# Patient Record
Sex: Male | Born: 1945
Health system: Southern US, Community
[De-identification: ages and names within clinical notes are randomized; demographics above are authoritative.]

## PROBLEM LIST (undated history)

## (undated) DIAGNOSIS — Z87442 Personal history of urinary calculi: Secondary | ICD-10-CM

## (undated) DIAGNOSIS — R7989 Other specified abnormal findings of blood chemistry: Secondary | ICD-10-CM

## (undated) DIAGNOSIS — E785 Hyperlipidemia, unspecified: Secondary | ICD-10-CM

## (undated) DIAGNOSIS — N200 Calculus of kidney: Secondary | ICD-10-CM

## (undated) HISTORY — PX: COLONOSCOPY: SHX174

## (undated) HISTORY — PX: HERNIA REPAIR: SHX51

## (undated) HISTORY — DX: Hyperlipidemia, unspecified: E78.5

---

## 1996-08-02 HISTORY — PX: HERNIA REPAIR: SHX51

## 2003-02-12 ENCOUNTER — Encounter: Payer: Self-pay | Admitting: Emergency Medicine

## 2003-02-12 ENCOUNTER — Emergency Department (HOSPITAL_COMMUNITY): Admission: EM | Admit: 2003-02-12 | Discharge: 2003-02-12 | Payer: Self-pay | Admitting: Emergency Medicine

## 2003-10-15 ENCOUNTER — Ambulatory Visit (HOSPITAL_COMMUNITY): Admission: RE | Admit: 2003-10-15 | Discharge: 2003-10-15 | Payer: Self-pay | Admitting: Internal Medicine

## 2005-02-15 ENCOUNTER — Ambulatory Visit: Payer: Self-pay | Admitting: Orthopedic Surgery

## 2005-03-15 ENCOUNTER — Ambulatory Visit: Payer: Self-pay | Admitting: Orthopedic Surgery

## 2008-01-01 ENCOUNTER — Observation Stay (HOSPITAL_COMMUNITY): Admission: RE | Admit: 2008-01-01 | Discharge: 2008-01-02 | Payer: Self-pay | Admitting: General Surgery

## 2008-01-04 ENCOUNTER — Emergency Department (HOSPITAL_COMMUNITY): Admission: EM | Admit: 2008-01-04 | Discharge: 2008-01-05 | Payer: Self-pay | Admitting: Emergency Medicine

## 2010-12-15 NOTE — H&P (Signed)
James Cline, James Cline NO.:  [id number]   MEDICAL RECORD NO.:  [id number]          PATIENT TYPE:  AMB   LOCATION:  DAY                           FACILITY:  [facility]   PHYSICIAN:  Tilford Pillar, MD      DATE OF BIRTH:  09/01/1945   DATE OF ADMISSION:  DATE OF DISCHARGE:  LH                              HISTORY & PHYSICAL   CHIEF COMPLAINT:  Pain in the right groin.   HISTORY OF PRESENT ILLNESS:  The patient is a 65 year old male with a  history of increasing discomfort in his right groin.  He states that  this has been ongoing since approximately October 16, 2007.  On 21st, he  noted pain in the right groin and noted increasing bulge in this area.  This bulging is intermittent.  It does improve in the morning, but  worsens with increased activity.  He describes the pain as a constant  aching pain, especially during the day, it persist.  He has had no  associated nausea, vomiting, fever, or chills.  No skin changes.  No  change in bowel movements.  No hematochezia.  No melena.  No prior  history of hernias.  He has had no history of bulging on the left side.  He has had no discomfort on the left side.  No history of claudications.   PAST MEDICAL HISTORY:  Hypercholesterolemia.   PAST SURGICAL HISTORY:  None.   MEDICATIONS:  Simvastatin.   ALLERGIES:  No known drug allergies.   SOCIAL HISTORY:  No tobacco, no alcohol use.  Occupation, he works as a  Production designer, theatre/television/film.  He does do some heavy lifting with work.   REVIEW OF SYSTEMS:  CONSTITUTIONAL:  Unremarkable.  EYES:  Unremarkable.  EARS, NOSE, AND THROAT:  Occasional rhinorrhea.  RESPIRATORY:  Unremarkable.  CARDIOVASCULAR:  Unremarkable.  GASTROINTESTINAL:  Unremarkable.  GENITOURINARY:  Unremarkable.  MUSCULOSKELETAL:  Unremarkable.  SKIN:  Unremarkable.  ENDO:  Unremarkable.  NEURO:  Unremarkable.   PHYSICAL EXAMINATION:  GENERAL:  The patient is a healthy well-appearing  male in no acute distress.  He is alert and  oriented x3.  HEENT:  Scalp with no deformities or masses.  EYES:  Pupils equal, round, and reactive.  Extraocular movements are  intact.  No conjunctival pallor is noted.  Oral mucosa is pink.  Normal  occlusion.  NECK:  Trachea is midline.  No cervical lymphadenopathy is appreciated.  PULMONARY:  Unlabored respirations.  No wheezes.  No crackles.  He is  clear to auscultation bilaterally.  CARDIOVASCULAR:  He does have an irregularly irregular rhythm, but this  is not in increased rate, occasional early beep noted.  No murmurs are  appreciated.  No gallops.  EXTREMITIES:  He has 2+ femoral pulses bilaterally.  ABDOMEN:  Positive bowel sounds.  Abdomen is soft and nontender.  He  does have bilateral inguinal hernias, but the right hernia being larger  than the left.  Both of these are reducible.  No masses are appreciated.  GENITOURINARY:  He has normal external appearing male genitalia.  He  has  bilateral descended testicles.  SKIN:  Warm and dry.   ASSESSMENT AND PLAN:  Bilateral inguinal hernias.  At this point, I had  a long conversation with the patient in regards to his hernia of the  right side of his symptomatic site.  I do recommend repairing this at a  sooner rather than later date.  It is reducible, and I did discuss with  the patient the possibility of repairing this in the future.  However,  he and his wife were in agreement, this should be repaired sooner rather  than later.  I also discussed with the patient that it is not the reason  for the repair to avoid strangulation or incarceration of the hernia  rather than try to alleviate his pain.  Risks, benefits, and  alternatives of herniorrhaphy with mesh were discussed at length with  the patient including but not limited to the risk of bleeding,  infection, ischemic orchitis, paresthesia, as well as the possibility of  testicular loss, as well as the possibility of intraoperative pulmonary  or cardiac events.  Typical  recovery was discussed at length with the  patient, but the patient and the patient's wife did further inquire  about repairing both hernias at the same time.  I did discuss that this  is a possibility, although with the bilateral pain that recovery may be  somewhat slow, but I did discuss the risks, benefits, and alternatives  of a solitary versus a bilateral repair.  The patient does wish to  proceed with the right hernia repair at this time.  He is going to think  about the possibility of proceeding with a bilateral inguinal  herniorrhaphy over the next couple of days.  He is to call if he changes  his mind and wish to proceed with bilateral hernia repair.      Tilford Pillar, MD  Electronically Signed     BZ/MEDQ  D:  12/28/2007  T:  12/29/2007  Job:  [id number]   cc:   Short Stay Surgery   Kingsley Callander. Ouida Sills, MD  Fax: [phone]

## 2010-12-15 NOTE — Op Note (Signed)
NAMECONNY, MOENING NO.:  [id number]   MEDICAL RECORD NO.:  [id number]          PATIENT TYPE:  AMB   LOCATION:  DAY                           FACILITY:  [facility]   PHYSICIAN:  Tilford Pillar, MD      DATE OF BIRTH:  1946/06/09   DATE OF PROCEDURE:  01/01/2008  DATE OF DISCHARGE:                               OPERATIVE REPORT   PREOPERATIVE DIAGNOSIS:  Bilateral inguinal hernias (reducible).   POSTOPERATIVE DIAGNOSIS:  Bilateral inguinal hernias (reducible).   PROCEDURE:  Right inguinal herniorrhaphy with mesh and left inguinal  herniorrhaphy with mesh via separate skin incisions   SURGEON:  Tilford Pillar, MD   ANESTHESIA:  MAC with laryngeal mask airway control, local anesthetic,  1% Sensorcaine plain.   SPECIMEN:  None.   ESTIMATED BLOOD LOSS:  Minimal.   INTRAOPERATIVE FINDINGS:  Right-sided large indirect hernia component,  left side small indirect and direct hernia components.   INDICATIONS:  The patient is a 65 year old male who presented to my  office as an outpatient on referral with complaints of right groin pain.  He was told to try Cardura and this has been going on for several months  but has increased in size and discomfort.  On evaluation, it was noted  that he indeed have a right inguinal hernia which was reducible.  On the  further evaluation, it was also noted that he had a small asymptomatic  left inguinal hernia.  Risks, benefits, and alternatives of hernia  repair were discussed at length with the patient including, but not  limited to risk of bleeding, mesh infection requiring the removal of the  mesh, and interval repair of the hernia as well as the possibility of  ischemic orchitis, testicular loss, and permanent paresthesia.  Possibility of intraoperative cardiac and pulmonary events were also  discussed with the patient.  It was discussed with the patient the risks  and alternatives of unilateral versus bilateral repair with  likely  increased pain on recovery with bilateral repair versus 2 separate  operations.  With time allotted to think about it, the patient did  wished to proceed with bilateral hernia repair and consent was obtained.   OPERATION IN DETAIL:  The patient was taken to the operating table and  placed in supine position on the operative table at which time the  anesthetic was administered.  Once the patient was asleep, laryngeal  mask airway was placed.  At this point, the patient's groins were  prepped and draped in the usual fashion.  A marking pen was utilized to  mark the planned incision on both sides.  A skin incision was made  initially on the right side with a scalpel.  Additional dissection down  to the subcuticular tissue including Scarpa fascia was carried out using  electrocautery.  Upon reaching the external oblique fascia, this was  scored with a scalpel and divided medially and laterally with the  Metzenbaum scissors.  At this point, a large hernia was identified.  The  hernia and the cord structures were freed from the surrounding  canal  with blunt finger dissection and then a window was created behind the  cord structures where a Penrose drain was placed in order to assist  elevating the cord structures up into the field.  The cord structures  were isolated.  The floor of the canal was then evaluated with no  evidence of any direct hernia component.  At this point, the hernia sac  was dissected free from the cord structures.  There was no incarcerated  material noted within the hernia sac which was twisted upon itself.  A  high suture ligation was performed using a 2-0 Novofil suture.  The  redundant portion of the peritoneal sac was divided using the  electrocautery and was disposed.  At this point, the remnant  demonstrated no evidence of any bleeding and it was allowed to reduce  back into the abdominal cavity.  At this time, a lipoma of the cord was  also dissected free  from the cord structures and once freed it was also  disposed with the divided sac material.  Using a Prolene mesh system, a  mesh plug was placed into the internal inguinal ring.  This was suture  secured with 2-0 Novofil sutures and then the mesh overlay was brought  into the field.  It was packed medially over the pubic tubercle,  inferiorly to the shelving portion of the inguinal ligament, and  superiorly to the conjoined tendon.  The keyhole defect was secured  around the cord structures and the tails were placed laterally  underneath the external oblique fascia.  I was quite pleased with the  appearance of the repair.  The wound was irrigated.  The external  oblique fascia was reapproximated using a 2-0 Vicryl and then a local  anesthetic was instilled.  A 3-0 Vicryl was utilized to reapproximate  the Scarpa fascia in a running continuous fashion and a 4-0 Monocryl was  utilized to reapproximate the skin edges with running subcuticular  suture.  As my attention was turned to the opposite side, a incision was  made in the left groin with a scalpel.  Additional dissection down to  the subcuticular tissue was carried out using electrocautery including  division of the Scarpa fascia.  The external oblique fascia was scored  with a scalpel and Metzenbaum scissors were utilized to further divide  the external oblique fascia medially and laterally with sharp  dissection.  At this point, the cord structures were identified.  A  window was created behind the cord structures.  A Penrose drain was  utilized to elevate the cord structures up into the field.  A indirect  hernia was identified as well as a small direct hernia.  The direct  hernia was dissected free from the cord structures and was high-ligated  with a 2-0 Novofil and divided using electrocautery.  The redundant sac  was disposed.  A mesh plug was placed into the internal inguinal ring.  A 2-0 Novofil was utilized to secure this x2  and then the mesh overlay  was inserted.  This was packed circumferentially using 2-0 Novofil,  again medially over the pubic tubercle, inferiorly to the shelving  portion of the inguinal ligament, and superiorly to the conjoined  tendon.  The tails were tucked underneath the external oblique fascia  and then the wound was irrigated.  2-0 Vicryl sutures were utilized to  reapproximate the external oblique fascia.  A 3-0 Vicryl was utilized to  reapproximate the Scarpa fascia and then a  4-0 Monocryl was utilized to  reapproximate the skin edges in a running subcuticular suture.  The skin  was washed and dried with moist and dry towels on both sides.  Benzoin  was applied on both incisions.  Half-inch Steri-Strips were placed and  the drapes were removed.  The patient was allowed to come out of the  general anesthetic and was transferred back to regular hospital bed and  was transferred to the postanesthetic care unit in stable condition.  At  the conclusion of the procedure, all instrument, sponge, and needle  counts were correct.  The patient tolerated the procedure well.      Tilford Pillar, MD  Electronically Signed     BZ/MEDQ  D:  01/01/2008  T:  01/02/2008  Job:  [id number]   cc:   Kingsley Callander. Ouida Sills, MD  Fax: [phone]

## 2010-12-18 NOTE — Op Note (Signed)
NAMESQUIRE, WITHEY                             ACCOUNT NO.:  [id number]   MEDICAL RECORD NO.:  [id number]                   PATIENT TYPE:  AMB   LOCATION:  DAY                                  FACILITY:  [facility]   PHYSICIAN:  Lionel December, M.D.                 DATE OF BIRTH:  [birth year]   DATE OF PROCEDURE:  10/15/2003  DATE OF DISCHARGE:                                 OPERATIVE REPORT   PROCEDURE:  Total colonoscopy.   INDICATIONS FOR PROCEDURE:  James Cline is a 65 year old Caucasian male who is here  for a screening colonoscopy.  He is free of any GI symptoms. Family history  is negative for colorectal carcinoma. The procedure is reviewed with the  patient and informed consent was obtained.   PREOP MEDICATIONS:  Demerol 25 mg IV, Versed 4 mg IV.   FINDINGS:  The procedure was performed in the Endoscopy Suite. The patient's  vital signs and O2 saturations were monitored during the procedure and  remained stable. The patient was placed in the left lateral decubitus  position and a rectal examination performed. No abnormality noted on  external or digital exam. The Olympus videoscope was placed in the rectum  and advanced under direct vision into the sigmoid colon. He had some  __________coating his mucosa.  Overall his preparation was felt to be quite  satisfactory.  The scope was passed through the cecum which was identified  by the appendiceal orifice and the ileocecal valve. Pictures taken for the  record. As the scope was withdrawn, the colonic mucosa was carefully  examined and was normal throughout. The rectal mucosa was also normal. The  scope was retroflexed to examine the anorectal junction and small  hemorrhoids were noted below the dentate line. The endoscope was  straightened and withdrawn. The patient tolerated the procedure well.   FINAL DIAGNOSES:  Small external hemorrhoids otherwise normal colonoscopy.   RECOMMENDATIONS:  He should continue yearly Hemoccults and consider  next  screening exam in 10 years from now.      ___________________________________________                                            Lionel December, M.D.   NR/MEDQ  D:  10/15/2003  T:  10/15/2003  Job:  [id number]   cc:   Kingsley Callander. Ouida Sills, M.D.  [address]  [place]  [place] [address]  Fax: [phone]

## 2011-01-15 ENCOUNTER — Other Ambulatory Visit (HOSPITAL_COMMUNITY): Payer: Self-pay | Admitting: Internal Medicine

## 2011-01-15 ENCOUNTER — Ambulatory Visit (HOSPITAL_COMMUNITY)
Admission: RE | Admit: 2011-01-15 | Discharge: 2011-01-15 | Disposition: A | Discharge status: Home / Self Care | Payer: BC Managed Care – PPO | Source: Ambulatory Visit | Attending: Internal Medicine | Admitting: Internal Medicine

## 2011-01-15 DIAGNOSIS — T148XXA Other injury of unspecified body region, initial encounter: Secondary | ICD-10-CM

## 2011-01-15 DIAGNOSIS — M79609 Pain in unspecified limb: Secondary | ICD-10-CM | POA: Insufficient documentation

## 2011-01-15 DIAGNOSIS — R52 Pain, unspecified: Secondary | ICD-10-CM

## 2011-01-15 DIAGNOSIS — M7730 Calcaneal spur, unspecified foot: Secondary | ICD-10-CM | POA: Insufficient documentation

## 2011-02-18 ENCOUNTER — Ambulatory Visit (INDEPENDENT_AMBULATORY_CARE_PROVIDER_SITE_OTHER): Payer: BC Managed Care – PPO | Admitting: Otolaryngology

## 2011-02-18 DIAGNOSIS — G4700 Insomnia, unspecified: Secondary | ICD-10-CM

## 2011-02-18 DIAGNOSIS — J342 Deviated nasal septum: Secondary | ICD-10-CM

## 2011-02-18 DIAGNOSIS — G4730 Sleep apnea, unspecified: Secondary | ICD-10-CM

## 2011-02-18 DIAGNOSIS — J343 Hypertrophy of nasal turbinates: Secondary | ICD-10-CM

## 2011-02-18 DIAGNOSIS — J310 Chronic rhinitis: Secondary | ICD-10-CM

## 2011-03-25 ENCOUNTER — Ambulatory Visit (INDEPENDENT_AMBULATORY_CARE_PROVIDER_SITE_OTHER): Payer: BC Managed Care – PPO | Admitting: Otolaryngology

## 2011-04-28 LAB — BASIC METABOLIC PANEL
CO2: 26
Calcium: 9
Creatinine, Ser: 1.01
GFR calc Af Amer: >60

## 2011-04-28 LAB — CBC
MCHC: 35.6
RBC: 5.01
RDW: 12.8

## 2011-04-29 LAB — DIFFERENTIAL
Basophils Absolute: 0
Basophils Relative: 0
Eosinophils Absolute: 0
Eosinophils Relative: 0
Lymphocytes Relative: 7 — ABNORMAL LOW

## 2011-04-29 LAB — BASIC METABOLIC PANEL
BUN: 8
GFR calc non Af Amer: >60
Glucose, Bld: 141 — ABNORMAL HIGH
Potassium: 3.8

## 2011-04-29 LAB — CBC
HCT: 44.8
MCHC: 35.5
MCV: 94.8
Platelets: 150
RDW: 12.7

## 2011-04-29 LAB — URINALYSIS, ROUTINE W REFLEX MICROSCOPIC
Ketones, ur: NEGATIVE
Nitrite: NEGATIVE
Protein, ur: NEGATIVE
Urobilinogen, UA: 0.2

## 2011-04-29 LAB — POCT CARDIAC MARKERS
CKMB, poc: <1 — ABNORMAL LOW
Myoglobin, poc: 81.3
Troponin i, poc: <0.05

## 2013-04-15 IMAGING — CR DG FOOT COMPLETE 3+V*L*
3 series · 3 of 3 positions shown · non-contrast
Comparison: None

CLINICAL DATA: Fifth metatarsal pain

LEFT FOOT - COMPLETE 3+ VIEW

[view not recorded (1 of 3)]
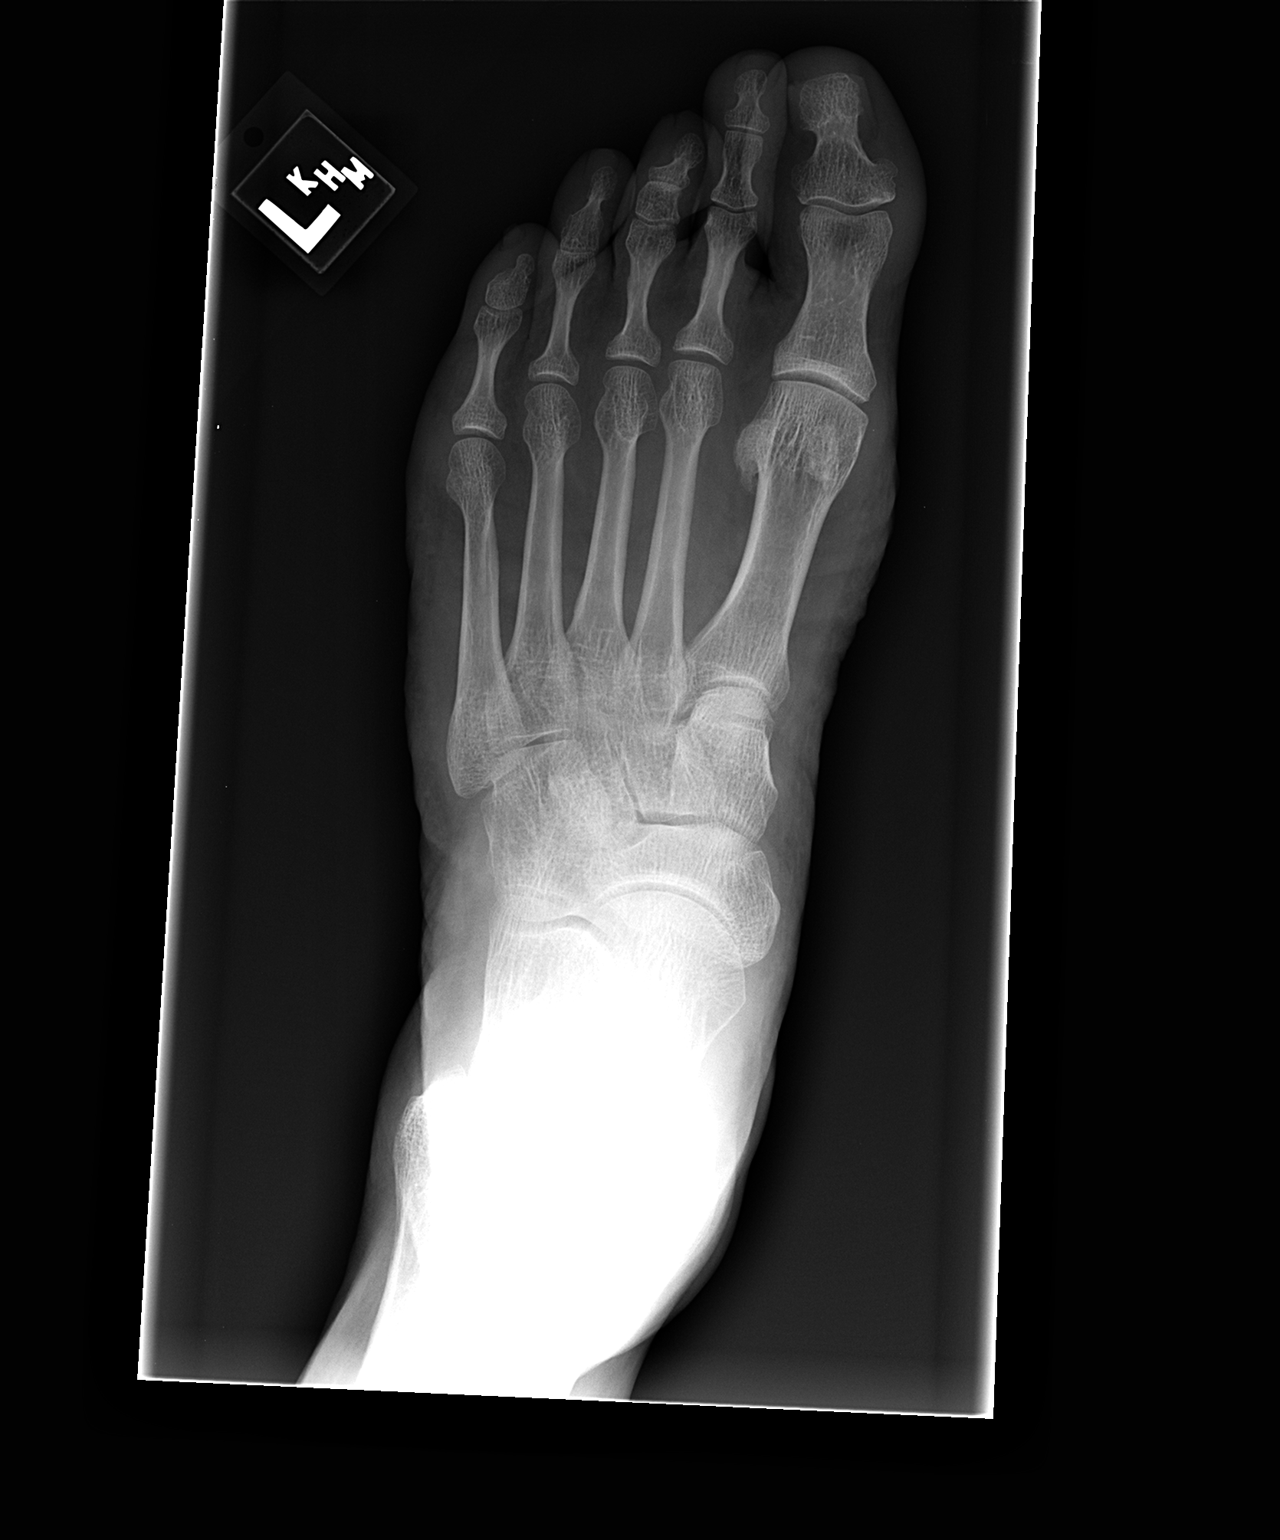

[view not recorded (2 of 3)]
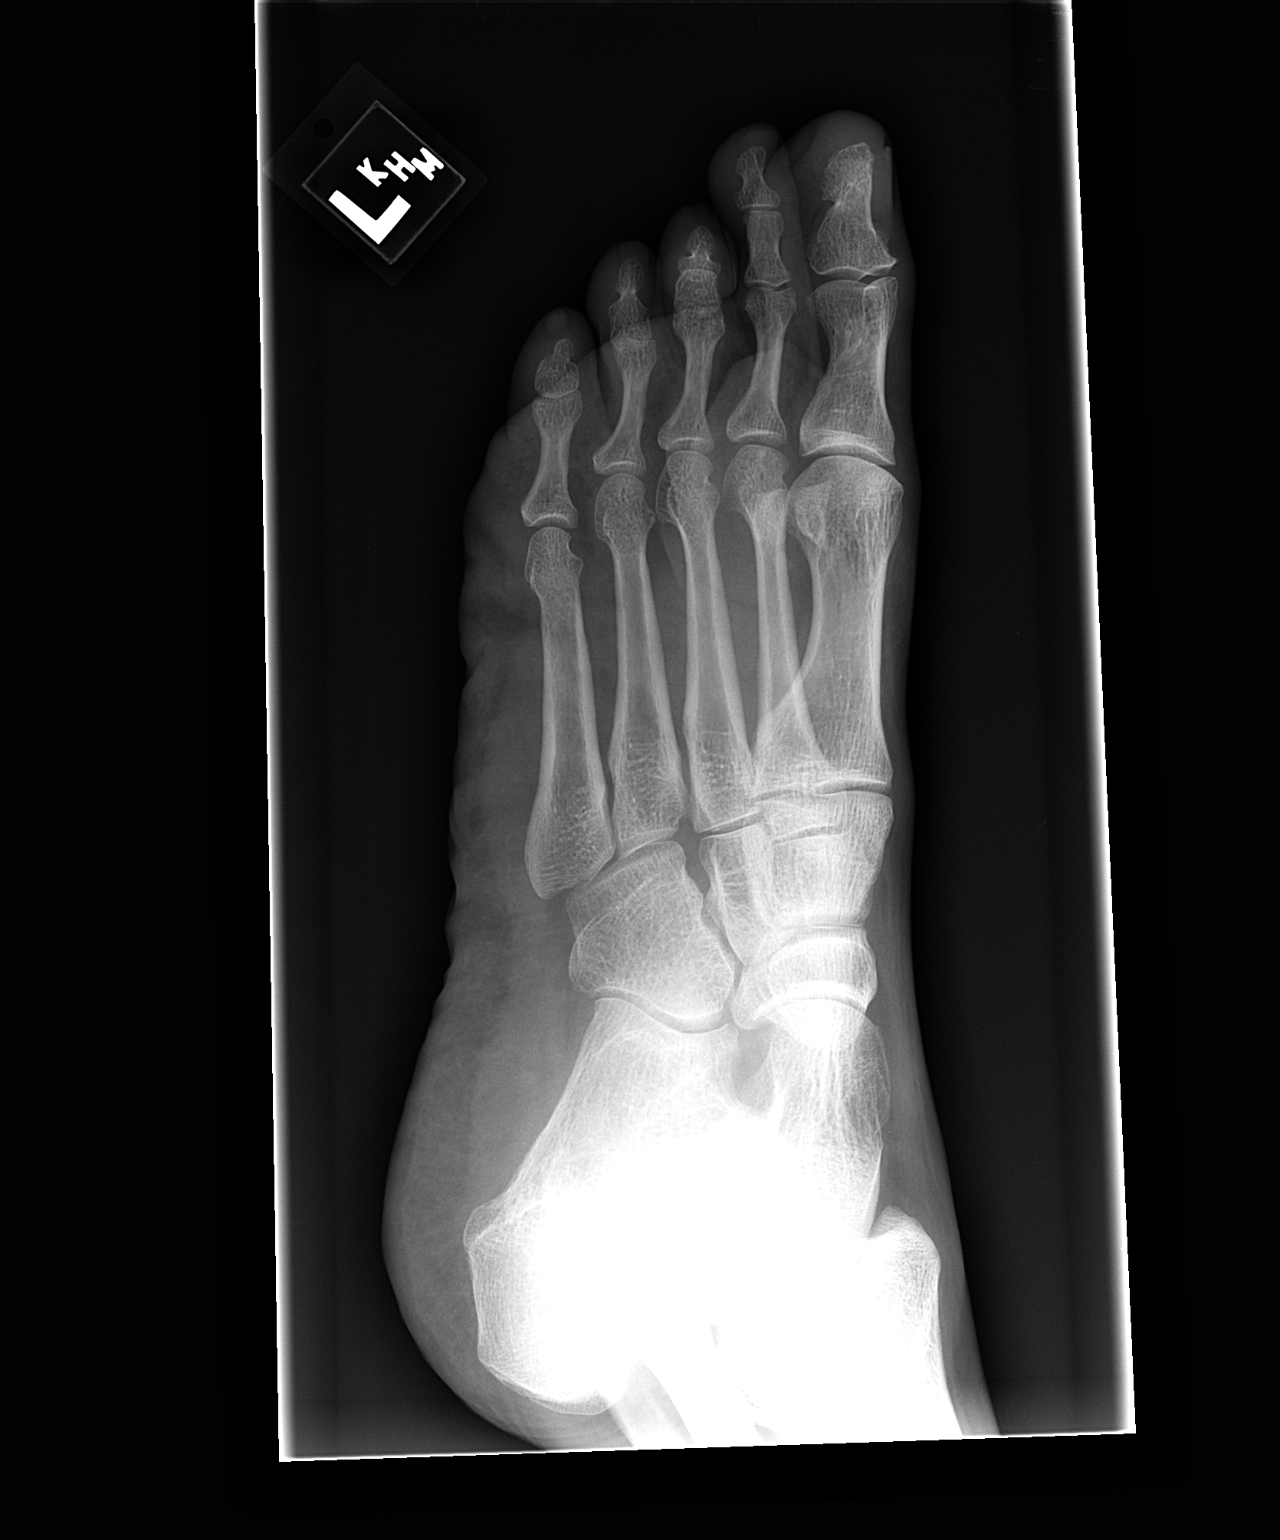

[view not recorded (3 of 3)]
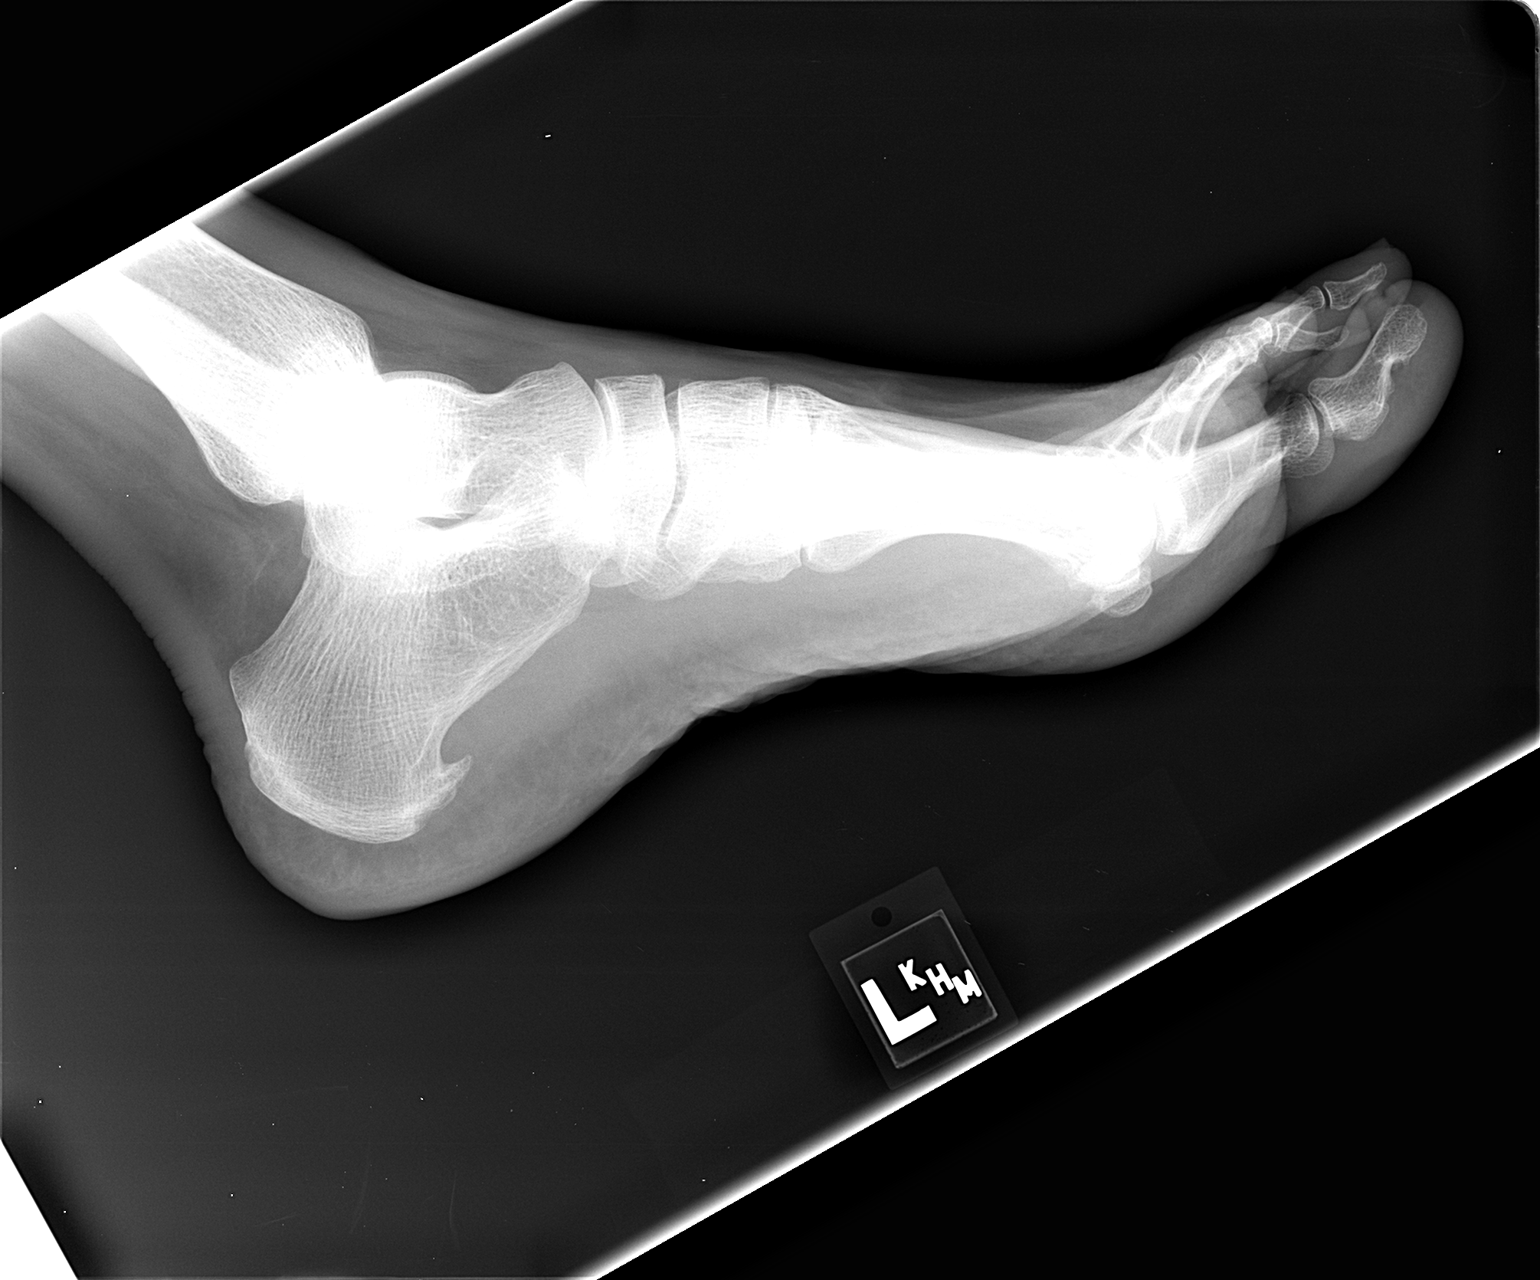

[3 of 3 positions shown; findings below may reference images not displayed]

FINDINGS: No evidence of fracture, dislocation, degenerative change
or other lesion in that area.  There is a small plantar calcaneal
spur.
IMPRESSION: No abnormalities seen in the lateral foot.  Small plantar calcaneal
spur.

## 2013-06-04 ENCOUNTER — Encounter (HOSPITAL_COMMUNITY): Payer: Self-pay | Admitting: Emergency Medicine

## 2013-06-04 ENCOUNTER — Emergency Department (HOSPITAL_COMMUNITY): Payer: BC Managed Care – PPO

## 2013-06-04 ENCOUNTER — Emergency Department (HOSPITAL_COMMUNITY)
Admission: EM | Admit: 2013-06-04 | Discharge: 2013-06-04 | Disposition: A | Discharge status: Home / Self Care | Payer: BC Managed Care – PPO | Attending: Emergency Medicine | Admitting: Emergency Medicine

## 2013-06-04 DIAGNOSIS — N200 Calculus of kidney: Secondary | ICD-10-CM | POA: Insufficient documentation

## 2013-06-04 DIAGNOSIS — Z87891 Personal history of nicotine dependence: Secondary | ICD-10-CM | POA: Insufficient documentation

## 2013-06-04 DIAGNOSIS — N201 Calculus of ureter: Secondary | ICD-10-CM | POA: Insufficient documentation

## 2013-06-04 DIAGNOSIS — E786 Lipoprotein deficiency: Secondary | ICD-10-CM | POA: Insufficient documentation

## 2013-06-04 DIAGNOSIS — Z79899 Other long term (current) drug therapy: Secondary | ICD-10-CM | POA: Insufficient documentation

## 2013-06-04 HISTORY — DX: Other specified abnormal findings of blood chemistry: R79.89

## 2013-06-04 HISTORY — DX: Calculus of kidney: N20.0

## 2013-06-04 LAB — URINALYSIS, ROUTINE W REFLEX MICROSCOPIC
Bilirubin Urine: NEGATIVE
Nitrite: NEGATIVE
Specific Gravity, Urine: >1.03 — ABNORMAL HIGH (ref 1.005–1.030)
Urobilinogen, UA: 0.2 mg/dL (ref 0.0–1.0)

## 2013-06-04 LAB — CBC WITH DIFFERENTIAL/PLATELET
Basophils Absolute: 0 10*3/uL (ref 0.0–0.1)
Eosinophils Absolute: 0 10*3/uL (ref 0.0–0.7)
Eosinophils Relative: 0 % (ref 0–5)
Lymphs Abs: 0.5 10*3/uL — ABNORMAL LOW (ref 0.7–4.0)
MCH: 33.1 pg (ref 26.0–34.0)
Neutrophils Relative %: 90 % — ABNORMAL HIGH (ref 43–77)
Platelets: 170 10*3/uL (ref 150–400)
RBC: 4.59 MIL/uL (ref 4.22–5.81)
RDW: 12 % (ref 11.5–15.5)
WBC: 10.6 10*3/uL — ABNORMAL HIGH (ref 4.0–10.5)

## 2013-06-04 LAB — BASIC METABOLIC PANEL
Calcium: 8.8 mg/dL (ref 8.4–10.5)
GFR calc Af Amer: 78 mL/min — ABNORMAL LOW (ref >90)
GFR calc non Af Amer: 67 mL/min — ABNORMAL LOW (ref >90)
Glucose, Bld: 129 mg/dL — ABNORMAL HIGH (ref 70–99)
Potassium: 3.8 mEq/L (ref 3.5–5.1)
Sodium: 140 mEq/L (ref 135–145)

## 2013-06-04 LAB — URINE MICROSCOPIC-ADD ON

## 2013-06-04 MED ORDER — SODIUM CHLORIDE 0.9 % IV SOLN
1000.0000 mL | Freq: Once | INTRAVENOUS | Status: AC
  Administered 2013-06-04: 1000 mL via INTRAVENOUS

## 2013-06-04 MED ORDER — HYDROMORPHONE HCL PF 1 MG/ML IJ SOLN
1.0000 mg | Freq: Once | INTRAMUSCULAR | Status: AC
  Administered 2013-06-04: 1 mg via INTRAVENOUS
  Filled 2013-06-04: qty 1

## 2013-06-04 MED ORDER — ONDANSETRON HCL 4 MG/2ML IJ SOLN
4.0000 mg | Freq: Once | INTRAMUSCULAR | Status: AC
  Administered 2013-06-04: 4 mg via INTRAVENOUS
  Filled 2013-06-04: qty 2

## 2013-06-04 MED ORDER — FENTANYL CITRATE 0.05 MG/ML IJ SOLN
50.0000 ug | Freq: Once | INTRAMUSCULAR | Status: AC
  Administered 2013-06-04: 50 ug via INTRAVENOUS
  Filled 2013-06-04: qty 2

## 2013-06-04 MED ORDER — OXYCODONE-ACETAMINOPHEN 5-325 MG PO TABS: 1.0000 | ORAL_TABLET | Freq: Four times a day (QID) | ORAL | Status: DC | PRN

## 2013-06-04 MED ORDER — ONDANSETRON 4 MG PO TBDP: 8.0000 mg | ORAL_TABLET | Freq: Three times a day (TID) | ORAL | Status: DC | PRN

## 2013-06-04 NOTE — ED Provider Notes (Signed)
CSN: [id number]     Arrival date & time 06/04/13  1033 History  This chart was scribed for Shelda Jakes, MD by Bennett Scrape, ED Scribe. This patient was seen in room [facility]03/[facility] and the patient's care was started at 11:49 AM.   Chief Complaint  Patient presents with  . Flank Pain   Patient is a 67 y.o. male presenting with flank pain. The history is provided by the patient. No language interpreter was used.  Flank Pain This is a new problem. The current episode started 6 to 12 hours ago. The problem occurs constantly. Pertinent negatives include no chest pain, no abdominal pain, no headaches and no shortness of breath. He has tried nothing for the symptoms.    HPI Comments: James Cline is a 67 y.o. male who presents to the Emergency Department complaining of constant right flank pain that started suddenly at 3:30 AM. The pain radiates to the back and he rates his pain 8.5 out of 10 currently. He denies any radiation to the groin or abdomen. He reports associated nausea and several episodes of emesis since onset. He has a h/o kidney stones with similarities. He denies any hematuria.   PCP is Dr. Ouida Sills  Past Medical History  Diagnosis Date  . Kidney stones   . Cholesterol blood decreased    Past Surgical History  Procedure Laterality Date  . Hernia repair     History reviewed. No pertinent family history. History  Substance Use Topics  . Smoking status: Former Games developer  . Smokeless tobacco: Not on file  . Alcohol Use: No    Review of Systems  Constitutional: Negative for fever and chills.  HENT: Negative for congestion and sore throat.   Eyes: Negative for visual disturbance.  Respiratory: Negative for cough and shortness of breath.   Cardiovascular: Negative for chest pain and leg swelling.  Gastrointestinal: Positive for nausea and vomiting. Negative for abdominal pain and diarrhea.  Genitourinary: Positive for flank pain. Negative for dysuria.  Musculoskeletal:  Positive for back pain. Negative for neck pain.  Skin: Negative for rash.  Neurological: Negative for headaches.  Hematological: Does not bruise/bleed easily.  Psychiatric/Behavioral: Negative for confusion.    Allergies  Review of patient's allergies indicates no known allergies.  Home Medications   Current Outpatient Rx  Name  Route  Sig  Dispense  Refill  . simvastatin (ZOCOR) 20 MG tablet   Oral   Take 20 mg by mouth every evening.         . ondansetron (ZOFRAN ODT) 4 MG disintegrating tablet   Oral   Take 2 tablets (8 mg total) by mouth every 8 (eight) hours as needed.   10 tablet   0   . oxyCODONE-acetaminophen (PERCOCET/ROXICET) 5-325 MG per tablet   Oral   Take 1-2 tablets by mouth every 6 (six) hours as needed for pain.   20 tablet   0    Triage vitals: BP 147/74  Pulse 89  Temp(Src) 97.7 F (36.5 C) (Oral)  Resp 20  Ht 5\' 6"  (1.676 m)  Wt 149 lb (67.586 kg)  BMI 24.06 kg/m2  SpO2 100%  Physical Exam  Nursing note and vitals reviewed. Constitutional: He is oriented to person, place, and time. He appears well-developed and well-nourished. No distress.  HENT:  Head: Normocephalic and atraumatic.  Mouth/Throat: Oropharynx is clear and moist.  Eyes: Conjunctivae and EOM are normal. Pupils are equal, round, and reactive to light.  Sclera are clear  Neck:  Neck supple. No tracheal deviation present.  Cardiovascular: Normal rate and regular rhythm.   No murmur heard. Pulses:      Dorsalis pedis pulses are 2+ on the right side, and 2+ on the left side.  Pulmonary/Chest: Effort normal and breath sounds normal. No respiratory distress. He has no wheezes.  Abdominal: Soft. Bowel sounds are normal. He exhibits no distension. There is no tenderness (including RLQ).  Musculoskeletal: Normal range of motion. He exhibits no edema (no ankle swelling).  Lymphadenopathy:    He has no cervical adenopathy.  Neurological: He is alert and oriented to person, place, and  time. No cranial nerve deficit.  Pt able to move both sets of fingers and toes  Skin: Skin is warm and dry. No rash noted.  Psychiatric: He has a normal mood and affect. His behavior is normal.    ED Course  Procedures (including critical care time)  DIAGNOSTIC STUDIES: Oxygen Saturation is 100% on room air, normal by my interpretation.    COORDINATION OF CARE: 11:52 AM-Discussed treatment plan which includes  (CXR, CBC panel, CMP, UA) with pt at bedside and pt agreed to plan.   Labs Review Labs Reviewed  URINALYSIS, ROUTINE W REFLEX MICROSCOPIC - Abnormal; Notable for the following:    Specific Gravity, Urine >1.030 (*)    Hgb urine dipstick LARGE (*)    Protein, ur TRACE (*)    All other components within normal limits  CBC WITH DIFFERENTIAL - Abnormal; Notable for the following:    WBC 10.6 (*)    Neutrophils Relative % 90 (*)    Neutro Abs 9.5 (*)    Lymphocytes Relative 5 (*)    Lymphs Abs 0.5 (*)    All other components within normal limits  BASIC METABOLIC PANEL - Abnormal; Notable for the following:    Glucose, Bld 129 (*)    GFR calc non Af Amer 67 (*)    GFR calc Af Amer 78 (*)    All other components within normal limits  URINE MICROSCOPIC-ADD ON - Abnormal; Notable for the following:    Bacteria, UA MANY (*)    All other components within normal limits   Results for orders placed during the hospital encounter of 06/04/13  URINALYSIS, ROUTINE W REFLEX MICROSCOPIC      Result Value Range   Color, Urine YELLOW  YELLOW   APPearance CLEAR  CLEAR   Specific Gravity, Urine >1.030 (*) 1.005 - 1.030   pH 6.0  5.0 - 8.0   Glucose, UA NEGATIVE  NEGATIVE mg/dL   Hgb urine dipstick LARGE (*) NEGATIVE   Bilirubin Urine NEGATIVE  NEGATIVE   Ketones, ur NEGATIVE  NEGATIVE mg/dL   Protein, ur TRACE (*) NEGATIVE mg/dL   Urobilinogen, UA 0.2  0.0 - 1.0 mg/dL   Nitrite NEGATIVE  NEGATIVE   Leukocytes, UA NEGATIVE  NEGATIVE  CBC WITH DIFFERENTIAL      Result Value Range    WBC 10.6 (*) 4.0 - 10.5 K/uL   RBC 4.59  4.22 - 5.81 MIL/uL   Hemoglobin 15.2  13.0 - 17.0 g/dL   HCT 82.9  56.2 - 13.0 %   MCV 93.9  78.0 - 100.0 fL   MCH 33.1  26.0 - 34.0 pg   MCHC 35.3  30.0 - 36.0 g/dL   RDW 86.5  78.4 - 69.6 %   Platelets 170  150 - 400 K/uL   Neutrophils Relative % 90 (*) 43 - 77 %   Neutro Abs  9.5 (*) 1.7 - 7.7 K/uL   Lymphocytes Relative 5 (*) 12 - 46 %   Lymphs Abs 0.5 (*) 0.7 - 4.0 K/uL   Monocytes Relative 5  3 - 12 %   Monocytes Absolute 0.5  0.1 - 1.0 K/uL   Eosinophils Relative 0  0 - 5 %   Eosinophils Absolute 0.0  0.0 - 0.7 K/uL   Basophils Relative 0  0 - 1 %   Basophils Absolute 0.0  0.0 - 0.1 K/uL  BASIC METABOLIC PANEL      Result Value Range   Sodium 140  135 - 145 mEq/L   Potassium 3.8  3.5 - 5.1 mEq/L   Chloride 105  96 - 112 mEq/L   CO2 26  19 - 32 mEq/L   Glucose, Bld 129 (*) 70 - 99 mg/dL   BUN 9  6 - 23 mg/dL   Creatinine, Ser 1.61  0.50 - 1.35 mg/dL   Calcium 8.8  8.4 - 09.6 mg/dL   GFR calc non Af Amer 67 (*) >90 mL/min   GFR calc Af Amer 78 (*) >90 mL/min  URINE MICROSCOPIC-ADD ON      Result Value Range   RBC / HPF TOO NUMEROUS TO COUNT  <3 RBC/hpf   Bacteria, UA MANY (*) RARE    Imaging Review Ct Abdomen Pelvis Wo Contrast  06/04/2013   CLINICAL DATA:  Right flank pain and lower back pain. Nausea. Micro hematuria. History of kidney stones.  EXAM: CT ABDOMEN AND PELVIS WITHOUT CONTRAST  TECHNIQUE: Multidetector CT imaging of the abdomen and pelvis was performed following the standard protocol without intravenous contrast.  COMPARISON:  None.  FINDINGS: There is minimal atelectasis or scarring at both lung bases. No pulmonary nodules or pleural effusions are identified. There is a proximal right ureteral stone, measuring 4 mm at the level of L2. Large right renal cyst measures 7.3 cm in diameter. There is perinephric stranding and right hydronephrosis as a result of obstruction.  The left kidney and ureter have a normal  appearance. No focal abnormalities identified within the liver, spleen, pancreas, or adrenal glands. The gallbladder is present.  The stomach and small bowel loops have a normal appearance. The appendix is well seen and has a normal appearance. The There is atherosclerotic calcification of the abdominal aorta. No aneurysm. Colonic loops are normal in appearance. Visualized osseous structures have a normal appearance.  IMPRESSION: 1. Obstructing right ureteral calculus, measuring 4 mm at the level of L2. 2. Large right renal cyst and a 7.3 cm.   Electronically Signed   By: Rosalie Gums M.D.   On: 06/04/2013 13:05    EKG Interpretation   None       MDM   1. Ureteral calculus, right    CT consistent with proximal right sided kidney stone measuring 4 mm. Should be passable. Renal function without significant abnormalities. Treat with pain medication and antinausea medicine. Patient improved here in the emergency department. No evidence urinary tract infection.  I personally performed the services described in this documentation, which was scribed in my presence. The recorded information has been reviewed and is accurate.     Shelda Jakes, MD 06/04/13 1345

## 2013-06-04 NOTE — Progress Notes (Signed)
ED/CM noted patient did not have health insurance and/or PCP listed in the computer.  Patient was given the [place] resource handout with information on the clinics, food pantries, and the handout for new health insurance sign-up.  Patient expressed appreciation for this. 

## 2013-06-04 NOTE — ED Notes (Signed)
Right flank pain since last, denies urinary sx's. N/v x 2. Grimacing noted.

## 2013-06-04 NOTE — ED Notes (Signed)
Woke early this morning w/severe R flank pain.  Vomited x 1.  Hx of renal stones. No dysuria, burning or stinging.

## 2014-08-27 ENCOUNTER — Encounter (INDEPENDENT_AMBULATORY_CARE_PROVIDER_SITE_OTHER): Payer: Self-pay | Admitting: *Deleted

## 2014-08-30 ENCOUNTER — Other Ambulatory Visit (INDEPENDENT_AMBULATORY_CARE_PROVIDER_SITE_OTHER): Payer: Self-pay | Admitting: *Deleted

## 2014-08-30 DIAGNOSIS — Z1211 Encounter for screening for malignant neoplasm of colon: Secondary | ICD-10-CM

## 2014-09-02 ENCOUNTER — Telehealth (INDEPENDENT_AMBULATORY_CARE_PROVIDER_SITE_OTHER): Payer: Self-pay | Admitting: *Deleted

## 2014-09-02 DIAGNOSIS — Z1211 Encounter for screening for malignant neoplasm of colon: Secondary | ICD-10-CM

## 2014-09-02 NOTE — Telephone Encounter (Signed)
Patient needs movi prep 

## 2014-09-05 MED ORDER — PEG-KCL-NACL-NASULF-NA ASC-C 100 G PO SOLR
1.0000 | Freq: Once | ORAL | Status: DC
Start: 2014-09-05 — End: 2014-10-23

## 2014-09-17 ENCOUNTER — Encounter (INDEPENDENT_AMBULATORY_CARE_PROVIDER_SITE_OTHER): Payer: Self-pay | Admitting: *Deleted

## 2014-09-24 ENCOUNTER — Telehealth (INDEPENDENT_AMBULATORY_CARE_PROVIDER_SITE_OTHER): Payer: Self-pay | Admitting: *Deleted

## 2014-09-24 NOTE — Telephone Encounter (Signed)
Referring MD/PCP: fagan    Procedure: tcs  Reason/Indication:  screening  Has patient had this procedure before?  Yes, 2005  If so, when, by whom and where?    Is there a family history of colon cancer?  no  Who?  What age when diagnosed?    Is patient diabetic?   no      Does patient have prosthetic heart valve?  no  Do you have a pacemaker?  no  Has patient ever had endocarditis? no  Has patient had joint replacement within last 12 months?  no  Does patient tend to be constipated or take laxatives? no  Is patient on Coumadin, Plavix and/or Aspirin? no  Medications: bc powder prn, simvastatin 40 mg daily  Allergies: nkda  Medication Adjustment: BC powder 2 days  Procedure date & time: 10/23/14 at 955

## 2014-09-24 NOTE — Telephone Encounter (Signed)
agree

## 2014-10-23 ENCOUNTER — Encounter (HOSPITAL_COMMUNITY)
Admission: RE | Disposition: A | Discharge status: Home / Self Care | Payer: Self-pay | Source: Ambulatory Visit | Attending: Internal Medicine

## 2014-10-23 ENCOUNTER — Encounter (HOSPITAL_COMMUNITY): Payer: Self-pay | Admitting: *Deleted

## 2014-10-23 ENCOUNTER — Ambulatory Visit (HOSPITAL_COMMUNITY)
Admission: RE | Admit: 2014-10-23 | Discharge: 2014-10-23 | Disposition: A | Discharge status: Home / Self Care | Payer: BLUE CROSS/BLUE SHIELD | Source: Ambulatory Visit | Attending: Internal Medicine | Admitting: Internal Medicine

## 2014-10-23 DIAGNOSIS — K648 Other hemorrhoids: Secondary | ICD-10-CM | POA: Diagnosis not present

## 2014-10-23 DIAGNOSIS — Z87442 Personal history of urinary calculi: Secondary | ICD-10-CM | POA: Diagnosis not present

## 2014-10-23 DIAGNOSIS — Z87891 Personal history of nicotine dependence: Secondary | ICD-10-CM | POA: Insufficient documentation

## 2014-10-23 DIAGNOSIS — K644 Residual hemorrhoidal skin tags: Secondary | ICD-10-CM | POA: Insufficient documentation

## 2014-10-23 DIAGNOSIS — Z1211 Encounter for screening for malignant neoplasm of colon: Secondary | ICD-10-CM | POA: Insufficient documentation

## 2014-10-23 DIAGNOSIS — Z79891 Long term (current) use of opiate analgesic: Secondary | ICD-10-CM | POA: Insufficient documentation

## 2014-10-23 DIAGNOSIS — Z79899 Other long term (current) drug therapy: Secondary | ICD-10-CM | POA: Insufficient documentation

## 2014-10-23 HISTORY — PX: COLONOSCOPY: SHX5424

## 2014-10-23 SURGERY — COLONOSCOPY
Anesthesia: Moderate Sedation

## 2014-10-23 MED ORDER — MIDAZOLAM HCL 5 MG/5ML IJ SOLN
INTRAMUSCULAR | Status: DC | PRN
  Administered 2014-10-23: 2 mg via INTRAVENOUS
  Administered 2014-10-23: 1 mg via INTRAVENOUS
  Administered 2014-10-23: 2 mg via INTRAVENOUS

## 2014-10-23 MED ORDER — MIDAZOLAM HCL 5 MG/5ML IJ SOLN
INTRAMUSCULAR | Status: AC
  Filled 2014-10-23: qty 10

## 2014-10-23 MED ORDER — MEPERIDINE HCL 50 MG/ML IJ SOLN
INTRAMUSCULAR | Status: AC
  Filled 2014-10-23: qty 1

## 2014-10-23 MED ORDER — STERILE WATER FOR IRRIGATION IR SOLN
Status: DC | PRN
  Administered 2014-10-23: 11:00:00

## 2014-10-23 MED ORDER — SODIUM CHLORIDE 0.9 % IV SOLN
INTRAVENOUS | Status: DC
  Administered 2014-10-23: 11:00:00 via INTRAVENOUS

## 2014-10-23 MED ORDER — MEPERIDINE HCL 50 MG/ML IJ SOLN
INTRAMUSCULAR | Status: DC | PRN
  Administered 2014-10-23 (×2): 25 mg via INTRAVENOUS

## 2014-10-23 NOTE — Discharge Instructions (Signed)
Resume usual medications and diet. °No driving for 24 hours. °Next screening exam in 10 years. ° ° °Colonoscopy, Care After °Refer to this sheet in the next few weeks. These instructions provide you with information on caring for yourself after your procedure. Your health care provider may also give you more specific instructions. Your treatment has been planned according to current medical practices, but problems sometimes occur. Call your health care provider if you have any problems or questions after your procedure. °WHAT TO EXPECT AFTER THE PROCEDURE  °After your procedure, it is typical to have the following: °· A small amount of blood in your stool. °· Moderate amounts of gas and mild abdominal cramping or bloating. °HOME CARE INSTRUCTIONS °· Do not drive, operate machinery, or sign important documents for 24 hours. °· You may shower and resume your regular physical activities, but move at a slower pace for the first 24 hours. °· Take frequent rest periods for the first 24 hours. °· Walk around or put a warm pack on your abdomen to help reduce abdominal cramping and bloating. °· Drink enough fluids to keep your urine clear or pale yellow. °· You may resume your normal diet as instructed by your health care provider. Avoid heavy or fried foods that are hard to digest. °· Avoid drinking alcohol for 24 hours or as instructed by your health care provider. °· Only take over-the-counter or prescription medicines as directed by your health care provider. °· If a tissue sample (biopsy) was taken during your procedure: °¨ Do not take aspirin or blood thinners for 7 days, or as instructed by your health care provider. °¨ Do not drink alcohol for 7 days, or as instructed by your health care provider. °¨ Eat soft foods for the first 24 hours. °SEEK MEDICAL CARE IF: °You have persistent spotting of blood in your stool 2-3 days after the procedure. °SEEK IMMEDIATE MEDICAL CARE IF: °· You have more than a small spotting of  blood in your stool. °· You pass large blood clots in your stool. °· Your abdomen is swollen (distended). °· You have nausea or vomiting. °· You have a fever. °· You have increasing abdominal pain that is not relieved with medicine. °Document Released: 03/02/2004 Document Revised: 05/09/2013 Document Reviewed: 03/26/2013 °ExitCare® Patient Information ©2015 [employer]. This information is not intended to replace advice given to you by your health care provider. Make sure you discuss any questions you have with your health care provider. ° °

## 2014-10-23 NOTE — Op Note (Signed)
COLONOSCOPY PROCEDURE REPORT  PATIENT:  James NuttingJohn M Faron  MR#:  [id number] Birthdate:  1946/06/21, 69 y.o., male Endoscopist:  Dr. Malissa HippoNajeeb U. Rehman, MD Referred By:  Dr. Carylon Perchesoy Fagan, MD  Procedure Date: 10/23/2014  Procedure:   Colonoscopy  Indications:  Patient is [age] old Caucasian male who is undergoing average risk screening colonoscopy. His last exam was in March 2005.  Informed Consent:  The procedure and risks were reviewed with the patient and informed consent was obtained.  Medications:  Demerol 50 mg IV Versed 5 mg IV  Description of procedure:  After a digital rectal exam was performed, that colonoscope was advanced from the anus through the rectum and colon to the area of the cecum, ileocecal valve and appendiceal orifice. The cecum was deeply intubated. These structures were well-seen and photographed for the record. From the level of the cecum and ileocecal valve, the scope was slowly and cautiously withdrawn. The mucosal surfaces were carefully surveyed utilizing scope tip to flexion to facilitate fold flattening as needed. The scope was pulled down into the rectum where a thorough exam including retroflexion was performed.  Findings:   Prep excellent. Normal mucosa of cecum, ascending colon, hepatic flexure, transverse colon, splenic flexure, descending and sigmoid colon. Normal rectal mucosa. Small hemorrhoids below the dentate line.  Therapeutic/Diagnostic Maneuvers Performed:   None  Complications:  None  Cecal Withdrawal Time:  10 minutes  Impression:  Normal colonoscopy except small external hemorrhoids.  Recommendations:  Standard instructions given. Next screening exam in 10 years.  REHMAN,NAJEEB U  10/23/2014 11:23 AM  CC: Dr. Carylon PerchesFAGAN,ROY, MD & Dr. Bonnetta BarryNo ref. provider found

## 2014-10-23 NOTE — H&P (Signed)
James Cline is an 69 y.o. male.   Chief Complaint: Patient is here for colonoscopy. HPI: Patient is 50 old Caucasian male who is here for screening colonoscopy. He denies abdominal pain change in bowel habits or rectal bleeding. Last colonoscopy was in March 2005. Family history is negative for CRC.  Past Medical History  Diagnosis Date  . Kidney stones   . Cholesterol blood decreased     Past Surgical History  Procedure Laterality Date  . Hernia repair    . Colonoscopy      History reviewed. No pertinent family history. Social History:  reports that he has quit smoking. His smoking use included Cigarettes. He has a 3.75 pack-year smoking history. He does not have any smokeless tobacco history on file. He reports that he does not drink alcohol or use illicit drugs.  Allergies: No Known Allergies  Medications Prior to Admission  Medication Sig Dispense Refill  . peg 3350 powder (MOVIPREP) 100 G SOLR Take 1 kit (200 g total) by mouth once. 1 kit 0  . ondansetron (ZOFRAN ODT) 4 MG disintegrating tablet Take 2 tablets (8 mg total) by mouth every 8 (eight) hours as needed. (Patient not taking: Reported on 10/08/2014) 10 tablet 0  . oxyCODONE-acetaminophen (PERCOCET/ROXICET) 5-325 MG per tablet Take 1-2 tablets by mouth every 6 (six) hours as needed for pain. (Patient not taking: Reported on 10/08/2014) 20 tablet 0  . simvastatin (ZOCOR) 20 MG tablet Take 20 mg by mouth every evening.      No results found for this or any previous visit (from the past 48 hour(s)). No results found.  ROS  Blood pressure 172/91, pulse 101, temperature 97.9 F (36.6 C), temperature source Oral, resp. rate 20, height $RemoveBe'5\' 6"'MUMSpASvW$  (1.676 m), weight 148 lb (67.132 kg), SpO2 97 %. Physical Exam  Constitutional: He appears well-developed and well-nourished.  HENT:  Mouth/Throat: Oropharynx is clear and moist.  Eyes: Conjunctivae are normal. No scleral icterus.  Neck: No thyromegaly present.  Cardiovascular: Normal  rate, regular rhythm and normal heart sounds.   No murmur heard. Respiratory: Effort normal and breath sounds normal.  GI: Soft. He exhibits no mass. There is no tenderness.  Musculoskeletal: He exhibits no edema.  Neurological: He is alert.  Skin: Skin is warm and dry.     Assessment/Plan Average risk screening colonoscopy.  REHMAN,NAJEEB U 10/23/2014, 10:52 AM

## 2014-10-24 ENCOUNTER — Encounter (HOSPITAL_COMMUNITY): Payer: Self-pay | Admitting: Internal Medicine

## 2015-05-20 ENCOUNTER — Encounter (HOSPITAL_COMMUNITY): Payer: Self-pay | Admitting: *Deleted

## 2015-05-20 ENCOUNTER — Emergency Department (HOSPITAL_COMMUNITY)
Admission: EM | Admit: 2015-05-20 | Discharge: 2015-05-20 | Disposition: A | Discharge status: Home / Self Care | Payer: BLUE CROSS/BLUE SHIELD | Attending: Emergency Medicine | Admitting: Emergency Medicine

## 2015-05-20 ENCOUNTER — Emergency Department (HOSPITAL_COMMUNITY): Payer: BLUE CROSS/BLUE SHIELD

## 2015-05-20 DIAGNOSIS — Z8639 Personal history of other endocrine, nutritional and metabolic disease: Secondary | ICD-10-CM | POA: Insufficient documentation

## 2015-05-20 DIAGNOSIS — I493 Ventricular premature depolarization: Secondary | ICD-10-CM | POA: Diagnosis not present

## 2015-05-20 DIAGNOSIS — Z87442 Personal history of urinary calculi: Secondary | ICD-10-CM | POA: Insufficient documentation

## 2015-05-20 DIAGNOSIS — Z87891 Personal history of nicotine dependence: Secondary | ICD-10-CM | POA: Insufficient documentation

## 2015-05-20 DIAGNOSIS — R002 Palpitations: Secondary | ICD-10-CM | POA: Diagnosis present

## 2015-05-20 LAB — COMPREHENSIVE METABOLIC PANEL
ALT: 23 U/L (ref 17–63)
AST: 27 U/L (ref 15–41)
Albumin: 4 g/dL (ref 3.5–5.0)
Alkaline Phosphatase: 72 U/L (ref 38–126)
Anion gap: 9 (ref 5–15)
BUN: 8 mg/dL (ref 6–20)
CHLORIDE: 109 mmol/L (ref 101–111)
CO2: 22 mmol/L (ref 22–32)
Calcium: 8.5 mg/dL — ABNORMAL LOW (ref 8.9–10.3)
Creatinine, Ser: 0.88 mg/dL (ref 0.61–1.24)
GFR calc Af Amer: >60 mL/min (ref >60)
GFR calc non Af Amer: >60 mL/min (ref >60)
GLUCOSE: 118 mg/dL — AB (ref 65–99)
POTASSIUM: 3.6 mmol/L (ref 3.5–5.1)
Sodium: 140 mmol/L (ref 135–145)
Total Bilirubin: 0.8 mg/dL (ref 0.3–1.2)
Total Protein: 6.7 g/dL (ref 6.5–8.1)

## 2015-05-20 LAB — CBC WITH DIFFERENTIAL/PLATELET
BASOS ABS: 0 10*3/uL (ref 0.0–0.1)
Basophils Relative: 1 %
EOS PCT: 3 %
Eosinophils Absolute: 0.1 10*3/uL (ref 0.0–0.7)
HCT: 44.4 % (ref 39.0–52.0)
Hemoglobin: 15.5 g/dL (ref 13.0–17.0)
Lymphocytes Relative: 32 %
Lymphs Abs: 1.4 10*3/uL (ref 0.7–4.0)
MCH: 33.3 pg (ref 26.0–34.0)
MCHC: 34.9 g/dL (ref 30.0–36.0)
MCV: 95.3 fL (ref 78.0–100.0)
MONO ABS: 0.4 10*3/uL (ref 0.1–1.0)
Monocytes Relative: 8 %
Neutro Abs: 2.5 10*3/uL (ref 1.7–7.7)
Neutrophils Relative %: 56 %
PLATELETS: 185 10*3/uL (ref 150–400)
RBC: 4.66 MIL/uL (ref 4.22–5.81)
RDW: 12.5 % (ref 11.5–15.5)
WBC: 4.4 10*3/uL (ref 4.0–10.5)

## 2015-05-20 LAB — BRAIN NATRIURETIC PEPTIDE: B Natriuretic Peptide: 65 pg/mL (ref 0.0–100.0)

## 2015-05-20 LAB — TROPONIN I: Troponin I: <0.03 ng/mL (ref <0.031)

## 2015-05-20 NOTE — ED Provider Notes (Signed)
CSN: [id number]     Arrival date & time 05/20/15  1052 History  By signing my name below, I, Marica Otter, attest that this documentation has been prepared under the direction and in the presence of No att. providers found. Electronically Signed: Marica Otter, ED Scribe. 05/20/2015. 11:51 AM.  Chief Complaint  Patient presents with  . Palpitations   The history is provided by the patient. No language interpreter was used.   PCP: Carylon Perches, MD HPI Comments: James Cline is a 69 y.o. male, with PMHx noted below, who works at [employer], who presents to the Emergency Department complaining of palpitations (each episode lasting 1-2 minutes). Pt reports he was unaware he was experiencing palpitations until he saw his nurse at work for his annual checkup today whereby the nurse informed him that he was having palpitations. Pt denies SOB, chest pain, dizziness, lightheadedness, constipation, any urinary Sx, near syncope, or any other Sx at this time.  Pt denies any daily meds (though pt notes he is Rx simvastatin, however, stopped taking them because his cholesterol is under control).   Past Medical History  Diagnosis Date  . Kidney stones   . Cholesterol blood decreased    Past Surgical History  Procedure Laterality Date  . Hernia repair    . Colonoscopy    . Colonoscopy N/A 10/23/2014    Procedure: COLONOSCOPY;  Surgeon: Malissa Hippo, MD;  Location: [facility];  Service: Endoscopy;  Laterality: N/A;  955   History reviewed. No pertinent family history. Social History  Substance Use Topics  . Smoking status: Former Smoker -- 0.25 packs/day for 15 years    Types: Cigarettes  . Smokeless tobacco: None  . Alcohol Use: No    Review of Systems A complete 10 system review of systems was obtained and all systems are negative except as noted in the HPI and PMH.  Allergies  Review of patient's allergies indicates no known allergies.  Home Medications   Prior to Admission medications    Not on File   Triage Vitals: BP 159/78 mmHg  Pulse 90  Temp(Src) 97.7 F (36.5 C) (Oral)  Resp 18  Ht  (1.676 m)  Wt 150 lb (68.04 kg)  BMI 24.22 kg/m2  SpO2 100% Physical Exam  Constitutional: He is oriented to person, place, and time. He appears well-developed and well-nourished. No distress.  HENT:  Head: Normocephalic and atraumatic.  Mouth/Throat: Oropharynx is clear and moist. No oropharyngeal exudate.  Eyes: Conjunctivae and EOM are normal. Pupils are equal, round, and reactive to light.  Neck: Normal range of motion. Neck supple.  No meningismus.  Cardiovascular: Regular rhythm, normal heart sounds and intact distal pulses.  Tachycardia present.   No murmur heard. Frequent PVCs on monitor.   Pulmonary/Chest: Effort normal and breath sounds normal. No respiratory distress.  Abdominal: Soft. There is no tenderness. There is no rebound and no guarding.  Musculoskeletal: Normal range of motion. He exhibits no edema or tenderness.  Neurological: He is alert and oriented to person, place, and time. No cranial nerve deficit. He exhibits normal muscle tone. Coordination normal.  No ataxia on finger to nose bilaterally. No pronator drift. 5/5 strength throughout. CN 2-12 intact. Negative Romberg. Equal grip strength. Sensation intact. Gait is normal.   Skin: Skin is warm.  Psychiatric: He has a normal mood and affect. His behavior is normal.  Nursing note and vitals reviewed.   ED Course  Procedures (including critical care time) DIAGNOSTIC STUDIES: Oxygen Saturation  is 100% on RA, nl by my interpretation.    COORDINATION OF CARE: 11:24 AM: Discussed treatment plan which includes imaging, labs, and EKG with pt at bedside; patient verbalizes understanding and agrees with treatment plan.  Labs Review Labs Reviewed  COMPREHENSIVE METABOLIC PANEL - Abnormal; Notable for the following:    Glucose, Bld 118 (*)    Calcium 8.5 (*)    All other components within normal  limits  CBC WITH DIFFERENTIAL/PLATELET  TROPONIN I  BRAIN NATRIURETIC PEPTIDE    Imaging Review Dg Chest 2 View  05/20/2015  CLINICAL DATA:  Palpitations, former smoker EXAM: CHEST  2 VIEW COMPARISON:  01/04/2008 FINDINGS: Normal heart size, mediastinal contours and pulmonary vascularity. Lungs hyperinflated but clear. No pleural effusion or pneumothorax. Diffuse osseous demineralization. IMPRESSION: Pulmonary hyperinflation question COPD. No acute infiltrate. Electronically Signed   By: Ulyses SouthwardMark  Boles M.D.   On: 05/20/2015 12:34   I have personally reviewed and evaluated these images and lab results as part of my medical decision-making.   EKG Interpretation   Date/Time:  Tuesday May 20 2015 11:25:15 EDT Ventricular Rate:  93 PR Interval:  152 QRS Duration: 98 QT Interval:  373 QTC Calculation: 464 R Axis:   10 Text Interpretation:  Sinus rhythm Multiple premature complexes, vent &  supraven Baseline wander in lead(s) V2 No significant change was found  Confirmed by Manus GunningANCOUR  MD, Kristell Wooding [phone]([id number] on 05/20/2015 11:36:57 AM      MDM   Final diagnoses:  PVC's (premature ventricular contractions)   patient from employee nurse with irregular heartbeat. He was told he has palpitations. He reports no symptoms. He denies any irregular heartbeat sensation, chest pain, shortness of breath, nausea or dizziness.  His EKG shows normal sinus rhythm with frequent PVCs. No acute ST changes.  Patient has no symptoms. EKG monitor showed frequent. This is likely what the employee health nurse identified. No chest pain, shortness of breath, dizziness.  Patient requesting discharge. Low suspicion for ACS or PE. Will be given cardiology follow-up information. Return precautions discussed.  I personally performed the services described in this documentation, which was scribed in my presence. The recorded information has been reviewed and is accurate.    Glynn OctaveStephen Chelesea Weiand, MD 05/20/15 [phone]

## 2015-05-20 NOTE — Discharge Instructions (Signed)
Premature Ventricular Contraction Follow up with the cardiologist. Return to the ED if you develop chest pain, shortness of breath, or any other concerns. A premature ventricular contraction is an irregularity in the normal heart rhythm. These contractions are extra heartbeats that occur too early in the normal sequence. In most cases, these contractions are harmless and do not require treatment. CAUSES Premature ventricular contractions may occur without a known cause. In healthy people, the extra contractions may be caused by:  Smoking.  Drinking alcohol.  Caffeine.  Certain medicines.  Some illegal drugs.  Stress. Sometimes, changes in chemicals in the blood (electrolytes) can also cause premature ventricular contractions. They can also occur in people with heart diseases that cause a decrease in blood flow to the heart. SIGNS AND SYMPTOMS Premature ventricular contractions often do not cause any symptoms. In some cases, you may have a feeling of your heart beating fast or skipping a beat (palpitations). DIAGNOSIS Your health care provider will take your medical history and do a physical exam. During the exam, the health care provider will check for irregular heartbeats. Various tests may be done to help diagnose premature ventricular contractions. These tests may include:  An ECG (electrocardiogram) to monitor the electrical activity of your heart.  Holter monitor testing. A Holter monitor is a portable device that can monitor the electrical activity of your heart over longer periods of time.  Stress tests to see how exercise affects your heart rhythm.  Echocardiogram. This test uses sound waves (ultrasound) to produce an image of your heart.  Electrophysiology study. This is used to evaluate the electrical conduction system of your heart. TREATMENT Usually, no treatment is needed. You may be advised to avoid things that can trigger the premature contractions, such as caffeine or  alcohol. Medicines are sometimes given if symptoms are severe or if the extra heartbeats are very frequent. Treatment may also be needed for an underlying cause of the contractions if one is found. HOME CARE INSTRUCTIONS  Take medicines only as directed by your health care provider.  Make any lifestyle changes recommended by your health care provider. These may include:  Quitting smoking.  Avoiding or limiting caffeine or alcohol.  Exercising. Talk to your health care provider about what type of exercise is safe for you.  Trying to reduce stress.  Keep all follow-up visits with your health care provider. This is important. SEEK IMMEDIATE MEDICAL CARE IF:  You feel palpitations that are frequent or continual.  You have chest pain.  You have shortness of breath.  You have sweating for no reason.  You have nausea and vomiting.  You become light-headed or faint.   This information is not intended to replace advice given to you by your health care provider. Make sure you discuss any questions you have with your health care provider.   Document Released: 03/05/2004 Document Revised: 08/09/2014 Document Reviewed: 12/20/2013 Elsevier Interactive Patient Education [employer]2016 Else[employer] Inc.

## 2015-05-20 NOTE — ED Notes (Signed)
Pt seen employer's nurse and stated that pt has palpitations, pt denies pain or SOB

## 2016-02-24 DIAGNOSIS — R7301 Impaired fasting glucose: Secondary | ICD-10-CM | POA: Diagnosis not present

## 2016-02-24 DIAGNOSIS — R002 Palpitations: Secondary | ICD-10-CM | POA: Diagnosis not present

## 2016-02-24 DIAGNOSIS — E785 Hyperlipidemia, unspecified: Secondary | ICD-10-CM | POA: Diagnosis not present

## 2016-02-24 DIAGNOSIS — Z008 Encounter for other general examination: Secondary | ICD-10-CM | POA: Diagnosis not present

## 2016-05-05 DIAGNOSIS — Z23 Encounter for immunization: Secondary | ICD-10-CM | POA: Diagnosis not present

## 2016-08-27 DIAGNOSIS — E785 Hyperlipidemia, unspecified: Secondary | ICD-10-CM | POA: Diagnosis not present

## 2016-08-27 DIAGNOSIS — N529 Male erectile dysfunction, unspecified: Secondary | ICD-10-CM | POA: Diagnosis not present

## 2016-08-27 DIAGNOSIS — Z79899 Other long term (current) drug therapy: Secondary | ICD-10-CM | POA: Diagnosis not present

## 2016-08-27 DIAGNOSIS — Z125 Encounter for screening for malignant neoplasm of prostate: Secondary | ICD-10-CM | POA: Diagnosis not present

## 2016-09-09 DIAGNOSIS — R7301 Impaired fasting glucose: Secondary | ICD-10-CM | POA: Diagnosis not present

## 2016-09-09 DIAGNOSIS — E785 Hyperlipidemia, unspecified: Secondary | ICD-10-CM | POA: Diagnosis not present

## 2016-09-09 DIAGNOSIS — Z6827 Body mass index (BMI) 27.0-27.9, adult: Secondary | ICD-10-CM | POA: Diagnosis not present

## 2016-09-21 DIAGNOSIS — E785 Hyperlipidemia, unspecified: Secondary | ICD-10-CM | POA: Diagnosis not present

## 2016-09-21 DIAGNOSIS — Z008 Encounter for other general examination: Secondary | ICD-10-CM | POA: Diagnosis not present

## 2016-09-21 DIAGNOSIS — R7301 Impaired fasting glucose: Secondary | ICD-10-CM | POA: Diagnosis not present

## 2016-09-21 DIAGNOSIS — Z713 Dietary counseling and surveillance: Secondary | ICD-10-CM | POA: Diagnosis not present

## 2017-01-19 DIAGNOSIS — H02052 Trichiasis without entropian right lower eyelid: Secondary | ICD-10-CM | POA: Diagnosis not present

## 2017-03-01 DIAGNOSIS — Z719 Counseling, unspecified: Secondary | ICD-10-CM | POA: Diagnosis not present

## 2017-03-01 DIAGNOSIS — E785 Hyperlipidemia, unspecified: Secondary | ICD-10-CM | POA: Diagnosis not present

## 2017-03-01 DIAGNOSIS — Z008 Encounter for other general examination: Secondary | ICD-10-CM | POA: Diagnosis not present

## 2017-03-01 DIAGNOSIS — R7301 Impaired fasting glucose: Secondary | ICD-10-CM | POA: Diagnosis not present

## 2017-05-31 DIAGNOSIS — Z23 Encounter for immunization: Secondary | ICD-10-CM | POA: Diagnosis not present

## 2017-09-05 DIAGNOSIS — Z125 Encounter for screening for malignant neoplasm of prostate: Secondary | ICD-10-CM | POA: Diagnosis not present

## 2017-09-05 DIAGNOSIS — R7301 Impaired fasting glucose: Secondary | ICD-10-CM | POA: Diagnosis not present

## 2017-09-05 DIAGNOSIS — E785 Hyperlipidemia, unspecified: Secondary | ICD-10-CM | POA: Diagnosis not present

## 2017-09-05 DIAGNOSIS — Z79899 Other long term (current) drug therapy: Secondary | ICD-10-CM | POA: Diagnosis not present

## 2017-09-12 DIAGNOSIS — R1319 Other dysphagia: Secondary | ICD-10-CM | POA: Diagnosis not present

## 2017-09-12 DIAGNOSIS — Z6826 Body mass index (BMI) 26.0-26.9, adult: Secondary | ICD-10-CM | POA: Diagnosis not present

## 2017-09-12 DIAGNOSIS — K219 Gastro-esophageal reflux disease without esophagitis: Secondary | ICD-10-CM | POA: Diagnosis not present

## 2017-09-12 DIAGNOSIS — E785 Hyperlipidemia, unspecified: Secondary | ICD-10-CM | POA: Diagnosis not present

## 2017-10-19 DIAGNOSIS — K219 Gastro-esophageal reflux disease without esophagitis: Secondary | ICD-10-CM | POA: Diagnosis not present

## 2018-01-13 DIAGNOSIS — H02052 Trichiasis without entropian right lower eyelid: Secondary | ICD-10-CM | POA: Diagnosis not present

## 2018-05-16 DIAGNOSIS — R220 Localized swelling, mass and lump, head: Secondary | ICD-10-CM | POA: Diagnosis not present

## 2018-05-25 DIAGNOSIS — B029 Zoster without complications: Secondary | ICD-10-CM | POA: Diagnosis not present

## 2018-09-04 DIAGNOSIS — B0229 Other postherpetic nervous system involvement: Secondary | ICD-10-CM | POA: Diagnosis not present

## 2018-10-17 DIAGNOSIS — K219 Gastro-esophageal reflux disease without esophagitis: Secondary | ICD-10-CM | POA: Diagnosis not present

## 2018-10-17 DIAGNOSIS — Z79899 Other long term (current) drug therapy: Secondary | ICD-10-CM | POA: Diagnosis not present

## 2018-10-17 DIAGNOSIS — Z125 Encounter for screening for malignant neoplasm of prostate: Secondary | ICD-10-CM | POA: Diagnosis not present

## 2018-10-17 DIAGNOSIS — E785 Hyperlipidemia, unspecified: Secondary | ICD-10-CM | POA: Diagnosis not present

## 2018-10-24 DIAGNOSIS — E785 Hyperlipidemia, unspecified: Secondary | ICD-10-CM | POA: Diagnosis not present

## 2018-10-24 DIAGNOSIS — R7301 Impaired fasting glucose: Secondary | ICD-10-CM | POA: Diagnosis not present

## 2018-11-17 ENCOUNTER — Emergency Department (HOSPITAL_COMMUNITY)
Admission: EM | Admit: 2018-11-17 | Discharge: 2018-11-17 | Disposition: A | Discharge status: Home / Self Care | Payer: BLUE CROSS/BLUE SHIELD | Attending: Emergency Medicine | Admitting: Emergency Medicine

## 2018-11-17 ENCOUNTER — Other Ambulatory Visit: Payer: Self-pay

## 2018-11-17 ENCOUNTER — Encounter (HOSPITAL_COMMUNITY): Payer: Self-pay | Admitting: Emergency Medicine

## 2018-11-17 DIAGNOSIS — R55 Syncope and collapse: Secondary | ICD-10-CM | POA: Insufficient documentation

## 2018-11-17 DIAGNOSIS — W19XXXA Unspecified fall, initial encounter: Secondary | ICD-10-CM | POA: Diagnosis not present

## 2018-11-17 DIAGNOSIS — R252 Cramp and spasm: Secondary | ICD-10-CM | POA: Diagnosis not present

## 2018-11-17 DIAGNOSIS — Z87891 Personal history of nicotine dependence: Secondary | ICD-10-CM | POA: Insufficient documentation

## 2018-11-17 DIAGNOSIS — R61 Generalized hyperhidrosis: Secondary | ICD-10-CM | POA: Diagnosis not present

## 2018-11-17 LAB — CBC WITH DIFFERENTIAL/PLATELET
Abs Immature Granulocytes: 0.04 K/uL (ref 0.00–0.07)
Basophils Absolute: 0 K/uL (ref 0.0–0.1)
Basophils Relative: 0 %
Eosinophils Absolute: 0.3 K/uL (ref 0.0–0.5)
Eosinophils Relative: 3 %
HCT: 49.5 % (ref 39.0–52.0)
Hemoglobin: 16.6 g/dL (ref 13.0–17.0)
Immature Granulocytes: 0 %
Lymphocytes Relative: 23 %
Lymphs Abs: 2.1 K/uL (ref 0.7–4.0)
MCH: 32.4 pg (ref 26.0–34.0)
MCHC: 33.5 g/dL (ref 30.0–36.0)
MCV: 96.5 fL (ref 80.0–100.0)
Monocytes Absolute: 0.8 K/uL (ref 0.1–1.0)
Monocytes Relative: 9 %
Neutro Abs: 5.8 K/uL (ref 1.7–7.7)
Neutrophils Relative %: 65 %
Platelets: 209 K/uL (ref 150–400)
RBC: 5.13 MIL/uL (ref 4.22–5.81)
RDW: 12.2 % (ref 11.5–15.5)
WBC: 9.1 K/uL (ref 4.0–10.5)
nRBC: 0 % (ref 0.0–0.2)

## 2018-11-17 LAB — BASIC METABOLIC PANEL WITH GFR
Anion gap: 11 (ref 5–15)
BUN: 14 mg/dL (ref 8–23)
CO2: 25 mmol/L (ref 22–32)
Calcium: 9 mg/dL (ref 8.9–10.3)
Chloride: 104 mmol/L (ref 98–111)
Creatinine, Ser: 1.09 mg/dL (ref 0.61–1.24)
GFR calc Af Amer: >60 mL/min
GFR calc non Af Amer: >60 mL/min
Glucose, Bld: 126 mg/dL — ABNORMAL HIGH (ref 70–99)
Potassium: 5.1 mmol/L (ref 3.5–5.1)
Sodium: 140 mmol/L (ref 135–145)

## 2018-11-17 LAB — MAGNESIUM: Magnesium: 2.1 mg/dL (ref 1.7–2.4)

## 2018-11-17 NOTE — ED Notes (Signed)
Pt wheeled to waiting room. Pt verbalized understanding of discharge instructions.   

## 2018-11-17 NOTE — ED Triage Notes (Addendum)
Pt comes from home via [facility]. Pt states he was standing at the foot of his bed because "I had a cramp in my right leg and the next thing I remember is waking up on the floor." Pt denies pain. Pt denies hitting head. Pt reports having 2 alcoholic drinks tonight.

## 2018-11-17 NOTE — ED Notes (Signed)
Pt ambulated around nurses station with no assistance, denies pain and denies dizziness

## 2018-11-17 NOTE — ED Provider Notes (Signed)
[facility] Provider Note   CSN: [id number] Arrival date & time: 11/17/18  0126    History   Chief Complaint Chief Complaint  Patient presents with  . Loss of Consciousness    HPI James Cline is a 73 y.o. male.      Loss of Consciousness  Episode history:  Single Most recent episode:  Today Timing:  Constant Progression:  Worsening Chronicity:  New Context: not blood draw and not medication change   Witnessed: yes   Relieved by:  None tried Worsened by:  Nothing Ineffective treatments:  None tried Associated symptoms: diaphoresis and nausea   Associated symptoms: no anxiety, no chest pain and no palpitations     Past Medical History:  Diagnosis Date  . Cholesterol blood decreased   . Kidney stones     There are no active problems to display for this patient.   Past Surgical History:  Procedure Laterality Date  . COLONOSCOPY    . COLONOSCOPY N/A 10/23/2014   Procedure: COLONOSCOPY;  Surgeon: Malissa HippoNajeeb U Rehman, MD;  Location: [facility];  Service: Endoscopy;  Laterality: N/A;  955  . HERNIA REPAIR          Home Medications    Prior to Admission medications   Not on File    Family History No family history on file.  Social History Social History   Tobacco Use  . Smoking status: Former Smoker    Packs/day: 0.25    Years: 15.00    Pack years: 3.75    Types: Cigarettes  . Smokeless tobacco: Never Used  Substance Use Topics  . Alcohol use: No  . Drug use: No     Allergies   Patient has no known allergies.   Review of Systems Review of Systems  Constitutional: Positive for diaphoresis.  Cardiovascular: Positive for syncope. Negative for chest pain and palpitations.  Gastrointestinal: Positive for nausea.  All other systems reviewed and are negative.    Physical Exam Updated Vital Signs BP 136/70   Pulse 93   Temp (!) 97.4 F (36.3 C) (Oral)   Resp 13   Ht 5\' 6"  (1.676 m)   Wt 72.6 kg   SpO2 93%   BMI  25.82 kg/m   Physical Exam Vitals signs and nursing note reviewed.  Constitutional:      Appearance: He is well-developed.  HENT:     Head: Normocephalic and atraumatic.     Mouth/Throat:     Mouth: Mucous membranes are dry.     Pharynx: Oropharynx is clear.  Eyes:     Extraocular Movements: Extraocular movements intact.     Conjunctiva/sclera: Conjunctivae normal.  Neck:     Musculoskeletal: Normal range of motion.  Cardiovascular:     Rate and Rhythm: Normal rate.  Pulmonary:     Effort: Pulmonary effort is normal. No respiratory distress.  Abdominal:     General: There is no distension.  Musculoskeletal: Normal range of motion.  Skin:    General: Skin is warm and dry.  Neurological:     General: No focal deficit present.     Mental Status: He is alert.      ED Treatments / Results  Labs (all labs ordered are listed, but only abnormal results are displayed) Labs Reviewed  BASIC METABOLIC PANEL - Abnormal; Notable for the following components:      Result Value   Glucose, Bld 126 (*)    All other components within normal limits  CBC WITH DIFFERENTIAL/PLATELET  MAGNESIUM    EKG EKG Interpretation  Date/Time:  Friday November 17 2018 01:30:08 EDT Ventricular Rate:  86 PR Interval:    QRS Duration: 106 QT Interval:  398 QTC Calculation: 476 R Axis:     Text Interpretation:  Sinus rhythm RSR' in V1 or V2, right VCD or RVH Borderline prolonged QT interval No acute changes Confirmed by Marily Memos [phone]) on 11/17/2018 1:39:29 AM   Radiology No results found.  Procedures Procedures (including critical care time)  Medications Ordered in ED Medications - No data to display   Initial Impression / Assessment and Plan / ED Course  I have reviewed the triage vital signs and the nursing notes.  Pertinent labs & imaging results that were available during my care of the patient were reviewed by me and considered in my medical decision making (see chart for  details).   Syncopal episode associated with severe pain in his leg.  States he has passed out multiple times the past secondary to severe pain.  This was similar to those where he weighed feel the pain and then get nauseous and sweaty and then woke up on the floor.  Did not hit his head.  Not hurting anywhere at this time.  No chest pain or shortness of breath or headache.  Suspect is likely vasovagal syncope with a pretty classic story.  EKG okay.  CBC and BMP okay.  Stable for discharge with PCP follow-up.  Final Clinical Impressions(s) / ED Diagnoses   Final diagnoses:  Syncope and collapse    ED Discharge Orders    None       Makayli Bracken, Barbara Cower, MD 11/17/18 [id number]

## 2019-03-01 DIAGNOSIS — H02052 Trichiasis without entropian right lower eyelid: Secondary | ICD-10-CM | POA: Diagnosis not present

## 2019-05-10 DIAGNOSIS — Z23 Encounter for immunization: Secondary | ICD-10-CM | POA: Diagnosis not present

## 2019-05-22 DIAGNOSIS — R7301 Impaired fasting glucose: Secondary | ICD-10-CM | POA: Diagnosis not present

## 2019-05-22 DIAGNOSIS — Z008 Encounter for other general examination: Secondary | ICD-10-CM | POA: Diagnosis not present

## 2019-05-22 DIAGNOSIS — Z719 Counseling, unspecified: Secondary | ICD-10-CM | POA: Diagnosis not present

## 2019-05-22 DIAGNOSIS — E785 Hyperlipidemia, unspecified: Secondary | ICD-10-CM | POA: Diagnosis not present

## 2019-07-03 DIAGNOSIS — Z139 Encounter for screening, unspecified: Secondary | ICD-10-CM | POA: Diagnosis not present

## 2019-07-03 DIAGNOSIS — R7301 Impaired fasting glucose: Secondary | ICD-10-CM | POA: Diagnosis not present

## 2019-07-03 DIAGNOSIS — Z79899 Other long term (current) drug therapy: Secondary | ICD-10-CM | POA: Diagnosis not present

## 2019-07-03 DIAGNOSIS — Z0130 Encounter for examination of blood pressure without abnormal findings: Secondary | ICD-10-CM | POA: Diagnosis not present

## 2019-07-03 DIAGNOSIS — E785 Hyperlipidemia, unspecified: Secondary | ICD-10-CM | POA: Diagnosis not present

## 2019-07-12 DIAGNOSIS — J019 Acute sinusitis, unspecified: Secondary | ICD-10-CM | POA: Diagnosis not present

## 2019-07-17 DIAGNOSIS — E559 Vitamin D deficiency, unspecified: Secondary | ICD-10-CM | POA: Diagnosis not present

## 2019-07-17 DIAGNOSIS — R7301 Impaired fasting glucose: Secondary | ICD-10-CM | POA: Diagnosis not present

## 2019-07-17 DIAGNOSIS — E785 Hyperlipidemia, unspecified: Secondary | ICD-10-CM | POA: Diagnosis not present

## 2019-07-17 DIAGNOSIS — Z719 Counseling, unspecified: Secondary | ICD-10-CM | POA: Diagnosis not present

## 2019-08-13 ENCOUNTER — Ambulatory Visit: Payer: BC Managed Care – PPO | Attending: Internal Medicine

## 2019-08-13 ENCOUNTER — Other Ambulatory Visit: Payer: Self-pay

## 2019-08-13 DIAGNOSIS — Z20822 Contact with and (suspected) exposure to covid-19: Secondary | ICD-10-CM | POA: Insufficient documentation

## 2019-08-15 LAB — NOVEL CORONAVIRUS, NAA: SARS-CoV-2, NAA: NOT DETECTED

## 2019-09-17 DIAGNOSIS — Z23 Encounter for immunization: Secondary | ICD-10-CM | POA: Diagnosis not present

## 2019-10-08 DIAGNOSIS — Z23 Encounter for immunization: Secondary | ICD-10-CM | POA: Diagnosis not present

## 2019-10-30 DIAGNOSIS — H02052 Trichiasis without entropian right lower eyelid: Secondary | ICD-10-CM | POA: Diagnosis not present

## 2019-11-05 DIAGNOSIS — R252 Cramp and spasm: Secondary | ICD-10-CM | POA: Diagnosis not present

## 2019-11-05 DIAGNOSIS — R7301 Impaired fasting glucose: Secondary | ICD-10-CM | POA: Diagnosis not present

## 2019-11-05 DIAGNOSIS — E785 Hyperlipidemia, unspecified: Secondary | ICD-10-CM | POA: Diagnosis not present

## 2020-04-24 ENCOUNTER — Emergency Department (HOSPITAL_COMMUNITY): Payer: BC Managed Care – PPO

## 2020-04-24 ENCOUNTER — Encounter (HOSPITAL_COMMUNITY): Payer: Self-pay | Admitting: Emergency Medicine

## 2020-04-24 ENCOUNTER — Other Ambulatory Visit: Payer: Self-pay

## 2020-04-24 ENCOUNTER — Emergency Department (HOSPITAL_COMMUNITY)
Admission: EM | Admit: 2020-04-24 | Discharge: 2020-04-24 | Disposition: A | Discharge status: Home / Self Care | Payer: BC Managed Care – PPO | Attending: Emergency Medicine | Admitting: Emergency Medicine

## 2020-04-24 DIAGNOSIS — R05 Cough: Secondary | ICD-10-CM | POA: Diagnosis not present

## 2020-04-24 DIAGNOSIS — J069 Acute upper respiratory infection, unspecified: Secondary | ICD-10-CM | POA: Insufficient documentation

## 2020-04-24 DIAGNOSIS — Z20822 Contact with and (suspected) exposure to covid-19: Secondary | ICD-10-CM | POA: Diagnosis not present

## 2020-04-24 DIAGNOSIS — Z87891 Personal history of nicotine dependence: Secondary | ICD-10-CM | POA: Diagnosis not present

## 2020-04-24 DIAGNOSIS — J9811 Atelectasis: Secondary | ICD-10-CM | POA: Diagnosis not present

## 2020-04-24 LAB — SARS CORONAVIRUS 2 BY RT PCR (HOSPITAL ORDER, PERFORMED IN ~~LOC~~ HOSPITAL LAB): SARS Coronavirus 2: NEGATIVE

## 2020-04-24 NOTE — Discharge Instructions (Addendum)
Drink plenty of fluids.  Take Mucinex DM over-the-counter for cough.  You can use throat lozenges to keep your throat moist or make sure you have a glass of water at the bedside to drink throughout the night.  Monitor your fever.  If you get a high fever, or your cough is not improved in the next week, let your primary care doctor know.  At that point they may want to start you on antibiotics.  Return to the emergency room if you have to struggle to breathe.

## 2020-04-24 NOTE — ED Triage Notes (Signed)
Patient states some cough and congestion that started a few days ago. Patient also complains of a sore throat. Patient was tested for COVID yesterday but has not received the results yet.

## 2020-04-24 NOTE — ED Provider Notes (Signed)
[facility] EMERGENCY DEPARTMENT Provider Note   CSN: [id number] Arrival date & time: 04/24/20  0145   Time seen 5:20 AM   History Chief Complaint  Patient presents with  . Cough    sore throat    James Cline is a 74 y.o. male.  HPI   Patient states about 3 to 4 days ago he started having a cough that is yellow without fever.  He states he only feels short of breath when he lays down.  He has some rhinorrhea but no sneezing.  He has a sore throat at night when he gets dry.  He denies nausea, vomiting, diarrhea, chest pain, or wheezing.  He denies being around anybody who is sick.  He denies significant smoking history or secondhand smoke exposure.  Patient has taken the Pfizer Covid vaccine.  PCP Carylon Perches, MD   Past Medical History:  Diagnosis Date  . Cholesterol blood decreased   . Kidney stones     There are no problems to display for this patient.   Past Surgical History:  Procedure Laterality Date  . COLONOSCOPY    . COLONOSCOPY N/A 10/23/2014   Procedure: COLONOSCOPY;  Surgeon: Malissa Hippo, MD;  Location: [facility];  Service: Endoscopy;  Laterality: N/A;  955  . HERNIA REPAIR         History reviewed. No pertinent family history.  Social History   Tobacco Use  . Smoking status: Former Smoker    Packs/day: 0.25    Years: 15.00    Pack years: 3.75    Types: Cigarettes  . Smokeless tobacco: Never Used  Substance Use Topics  . Alcohol use: No  . Drug use: No  Lives with spouse Employed  Home Medications Prior to Admission medications   Not on File    Allergies    Patient has no known allergies.  Review of Systems   Review of Systems  All other systems reviewed and are negative.   Physical Exam Updated Vital Signs BP (!) 165/86 (BP Location: Right Arm)   Pulse (!) 104   Temp 98.6 F (37 C) (Oral)   Resp 20   Ht 5\' 6"  (1.676 m)   Wt 71.7 kg   SpO2 96%   BMI 25.50 kg/m   Physical Exam Vitals and nursing note reviewed.    Constitutional:      General: He is not in acute distress.    Appearance: Normal appearance. He is normal weight.  HENT:     Head: Normocephalic and atraumatic.     Right Ear: External ear normal.     Left Ear: External ear normal.     Nose: Nose normal.     Mouth/Throat:     Mouth: Mucous membranes are moist.     Pharynx: No oropharyngeal exudate or posterior oropharyngeal erythema.     Comments: Voice is normal Eyes:     Extraocular Movements: Extraocular movements intact.     Conjunctiva/sclera: Conjunctivae normal.     Pupils: Pupils are equal, round, and reactive to light.  Cardiovascular:     Rate and Rhythm: Normal rate and regular rhythm.     Pulses: Normal pulses.     Heart sounds: Normal heart sounds.  Pulmonary:     Effort: Pulmonary effort is normal. No respiratory distress.     Breath sounds: Normal breath sounds. No wheezing, rhonchi or rales.  Musculoskeletal:        General: No swelling. Normal range of motion.  Cervical back: Normal range of motion.  Skin:    General: Skin is warm and dry.  Neurological:     General: No focal deficit present.     Mental Status: He is alert and oriented to person, place, and time.     Cranial Nerves: No cranial nerve deficit.  Psychiatric:        Mood and Affect: Mood normal.        Behavior: Behavior normal.        Thought Content: Thought content normal.     ED Results / Procedures / Treatments   Labs (all labs ordered are listed, but only abnormal results are displayed) Results for orders placed or performed during the hospital encounter of 04/24/20  SARS Coronavirus 2 by RT PCR (hospital order, performed in [facility] hospital lab) Nasopharyngeal Nasopharyngeal Swab   Specimen: Nasopharyngeal Swab  Result Value Ref Range   SARS Coronavirus 2 NEGATIVE NEGATIVE      EKG None  Radiology DG Chest Portable 1 View  Result Date: 04/24/2020 CLINICAL DATA:  Cough and congestion for several days EXAM: PORTABLE  CHEST 1 VIEW COMPARISON:  05/20/2015 FINDINGS: Cardiac shadow is stable. Mild platelike atelectasis is noted in the left base. No sizable effusion is seen. No other infiltrate is noted. No bony abnormality seen. IMPRESSION: Left basilar atelectasis.  No other focal abnormality is noted. Electronically Signed   By: Alcide Clever M.D.   On: 04/24/2020 02:39    Procedures Procedures (including critical care time)  Medications Ordered in ED Medications - No data to display  ED Course  I have reviewed the triage vital signs and the nursing notes.  Pertinent labs & imaging results that were available during my care of the patient were reviewed by me and considered in my medical decision making (see chart for details).    MDM Rules/Calculators/A&P                          We discussed his Covid test is negative.  We also discussed he most likely has a viral URI.  He is only had the symptoms a couple days so at this point antibiotics are not indicated.  He will be advised on symptomatic care and if he continues to have symptoms that last longer than 7 to 10 days he should let his primary care doctor know so he can be put on antibiotics.  Patient is not having any wheezing or rhonchi so I do not feel like an inhaler will help.   Final Clinical Impression(s) / ED Diagnoses Final diagnoses:  Upper respiratory tract infection, unspecified type    Rx / DC Orders ED Discharge Orders    None     Plan discharge  Devoria Albe, MD, Concha Pyo, MD 04/24/20 [phone]

## 2020-07-02 DIAGNOSIS — H02051 Trichiasis without entropian right upper eyelid: Secondary | ICD-10-CM | POA: Diagnosis not present

## 2020-08-06 DIAGNOSIS — N529 Male erectile dysfunction, unspecified: Secondary | ICD-10-CM | POA: Diagnosis not present

## 2020-08-06 DIAGNOSIS — B356 Tinea cruris: Secondary | ICD-10-CM | POA: Diagnosis not present

## 2021-02-10 DIAGNOSIS — E785 Hyperlipidemia, unspecified: Secondary | ICD-10-CM | POA: Diagnosis not present

## 2021-02-10 DIAGNOSIS — N529 Male erectile dysfunction, unspecified: Secondary | ICD-10-CM | POA: Diagnosis not present

## 2021-02-10 DIAGNOSIS — R7301 Impaired fasting glucose: Secondary | ICD-10-CM | POA: Diagnosis not present

## 2021-02-10 DIAGNOSIS — Z79899 Other long term (current) drug therapy: Secondary | ICD-10-CM | POA: Diagnosis not present

## 2021-02-10 DIAGNOSIS — Z125 Encounter for screening for malignant neoplasm of prostate: Secondary | ICD-10-CM | POA: Diagnosis not present

## 2021-02-17 DIAGNOSIS — Z6826 Body mass index (BMI) 26.0-26.9, adult: Secondary | ICD-10-CM | POA: Diagnosis not present

## 2021-02-17 DIAGNOSIS — R7301 Impaired fasting glucose: Secondary | ICD-10-CM | POA: Diagnosis not present

## 2021-02-17 DIAGNOSIS — E785 Hyperlipidemia, unspecified: Secondary | ICD-10-CM | POA: Diagnosis not present

## 2021-02-17 DIAGNOSIS — Z0000 Encounter for general adult medical examination without abnormal findings: Secondary | ICD-10-CM | POA: Diagnosis not present

## 2021-02-17 DIAGNOSIS — N529 Male erectile dysfunction, unspecified: Secondary | ICD-10-CM | POA: Diagnosis not present

## 2022-01-26 ENCOUNTER — Encounter (INDEPENDENT_AMBULATORY_CARE_PROVIDER_SITE_OTHER): Payer: Self-pay | Admitting: *Deleted

## 2022-01-26 DIAGNOSIS — R1310 Dysphagia, unspecified: Secondary | ICD-10-CM | POA: Diagnosis not present

## 2022-02-09 DIAGNOSIS — H02052 Trichiasis without entropian right lower eyelid: Secondary | ICD-10-CM | POA: Diagnosis not present

## 2022-03-22 ENCOUNTER — Encounter (INDEPENDENT_AMBULATORY_CARE_PROVIDER_SITE_OTHER): Payer: Self-pay | Admitting: Gastroenterology

## 2022-03-22 ENCOUNTER — Ambulatory Visit (INDEPENDENT_AMBULATORY_CARE_PROVIDER_SITE_OTHER): Payer: BC Managed Care – PPO | Admitting: Gastroenterology

## 2022-03-22 DIAGNOSIS — K219 Gastro-esophageal reflux disease without esophagitis: Secondary | ICD-10-CM

## 2022-03-22 DIAGNOSIS — R1319 Other dysphagia: Secondary | ICD-10-CM

## 2022-03-22 DIAGNOSIS — R1310 Dysphagia, unspecified: Secondary | ICD-10-CM | POA: Insufficient documentation

## 2022-03-22 NOTE — Progress Notes (Signed)
Katrinka Blazing, M.D. Gastroenterology & Hepatology [facility] [address] [place], [place] [address] Primary Care Physician: Carylon Perches, MD [address] [place] [address]  Referring MD: PCP  Chief Complaint:  dysphagia  History of Present Illness: James Cline is a 76 y.o. male with PMH HLD and GERD, who presents for evaluation of dysphagia.  Patient states that possibly 2 months ago he was having some dysphagia to solids. He does not remember specifically what kind of food caused his symptoms of dysphagia. He states that maybe a couple of times the food did not go down and he had to vomit the food. He was also having frequent burping and heartburn sensation, along with sour taste in his mouth frequently. No odynophagia.  He was started on pantoprazole 40 mg qday in the AM (possibly an hour prior to his breakfast) a month ago. This led to complete resolution of dysphagia and heartburn. He is currently asymptomatic.  The patient denies having any nausea, vomiting, fever, chills, hematochezia, melena, hematemesis, abdominal distention, abdominal pain, diarrhea, jaundice, pruritus or weight loss.  Last BJS:EGBTD Last Colonoscopy:2016 Normal colonoscopy except small external hemorrhoids.  FHx: neg for any gastrointestinal/liver disease, no malignancies Social: neg smoking, alcohol or illicit drug use Surgical: abdominal hernias x3  Past Medical History: Past Medical History:  Diagnosis Date   Cholesterol blood decreased    Hyperlipemia    Kidney stones     Past Surgical History: Past Surgical History:  Procedure Laterality Date   COLONOSCOPY     COLONOSCOPY N/A 10/23/2014   Procedure: COLONOSCOPY;  Surgeon: Malissa Hippo, MD;  Location: [facility];  Service: Endoscopy;  Laterality: N/A;  955   HERNIA REPAIR  1998    Family History:No family history on file.  Social History: Social History    Tobacco Use  Smoking Status Former   Packs/day: 0.25   Years: 15.00   Total pack years: 3.75   Types: Cigarettes   Passive exposure: Past  Smokeless Tobacco Never   Social History   Substance and Sexual Activity  Alcohol Use No   Social History   Substance and Sexual Activity  Drug Use No    Allergies: No Known Allergies  Medications: Current Outpatient Medications  Medication Sig Dispense Refill   pantoprazole (PROTONIX) 40 MG tablet Take 40 mg by mouth every morning.     simvastatin (ZOCOR) 40 MG tablet Take 40 mg by mouth at bedtime.     No current facility-administered medications for this visit.    Review of Systems: GENERAL: negative for malaise, night sweats HEENT: No changes in hearing or vision, no nose bleeds or other nasal problems. NECK: Negative for lumps, goiter, pain and significant neck swelling RESPIRATORY: Negative for cough, wheezing CARDIOVASCULAR: Negative for chest pain, leg swelling, palpitations, orthopnea GI: SEE HPI MUSCULOSKELETAL: Negative for joint pain or swelling, back pain, and muscle pain. SKIN: Negative for lesions, rash PSYCH: Negative for sleep disturbance, mood disorder and recent psychosocial stressors. HEMATOLOGY Negative for prolonged bleeding, bruising easily, and swollen nodes. ENDOCRINE: Negative for cold or heat intolerance, polyuria, polydipsia and goiter. NEURO: negative for tremor, gait imbalance, syncope and seizures. The remainder of the review of systems is noncontributory.   Physical Exam: BP (!) 150/74 (BP Location: Right Arm, Patient Position: Sitting, Cuff Size: Normal)   Pulse 83   Temp 97.9 F (36.6 C) (Oral)   Ht 5\' 6"  (1.676 m)   Wt 154 lb 9.6  oz (70.1 kg)   BMI 24.95 kg/m  GENERAL: The patient is AO x3, in no acute distress. HEENT: Head is normocephalic and atraumatic. EOMI are intact. Mouth is well hydrated and without lesions. NECK: Supple. No masses LUNGS: Clear to auscultation. No presence  of rhonchi/wheezing/rales. Adequate chest expansion HEART: RRR, normal s1 and s2. ABDOMEN: Soft, nontender, no guarding, no peritoneal signs, and nondistended. BS +. No masses. EXTREMITIES: Without any cyanosis, clubbing, rash, lesions or edema. NEUROLOGIC: AOx3, no focal motor deficit. SKIN: no jaundice, no rashes   Imaging/Labs: as above  I personally reviewed and interpreted the available labs, imaging and endoscopic files.  Impression and Plan: James Cline is a 76 y.o. male with PMH HLD and GERD, who presents for evaluation of dysphagia.  Patient had new onset of dysphagia to solids that improved with the use of PPI.  I consider that given the resolution of his symptoms it is likely this is GERD related.  The likelihood of malignancy is less likely given the absence of red flag signs but given his age we will investigate this further with a barium esophagram.  For now he can continue taking pantoprazole 40 mg every day.  - Schedule barium esophagram with pill - Continue pantoprazole 40 mg qday  All questions were answered.      Katrinka Blazing, MD Gastroenterology and Hepatology [facility]

## 2022-03-22 NOTE — Patient Instructions (Signed)
Schedule barium esophagram with pill Continue pantoprazole 40 mg qday

## 2022-03-23 ENCOUNTER — Encounter (INDEPENDENT_AMBULATORY_CARE_PROVIDER_SITE_OTHER): Payer: Self-pay

## 2022-03-23 ENCOUNTER — Other Ambulatory Visit (INDEPENDENT_AMBULATORY_CARE_PROVIDER_SITE_OTHER): Payer: Self-pay

## 2022-03-23 DIAGNOSIS — R1319 Other dysphagia: Secondary | ICD-10-CM

## 2022-03-26 ENCOUNTER — Encounter (HOSPITAL_COMMUNITY): Payer: Self-pay

## 2022-03-26 ENCOUNTER — Ambulatory Visit (HOSPITAL_COMMUNITY)
Admission: RE | Admit: 2022-03-26 | Discharge: 2022-03-26 | Disposition: A | Discharge status: Home / Self Care | Payer: BC Managed Care – PPO | Source: Ambulatory Visit | Attending: Gastroenterology | Admitting: Gastroenterology

## 2022-03-26 ENCOUNTER — Ambulatory Visit (HOSPITAL_COMMUNITY): Admit: 2022-03-26 | Payer: BC Managed Care – PPO | Admitting: Gastroenterology

## 2022-03-26 DIAGNOSIS — R1310 Dysphagia, unspecified: Secondary | ICD-10-CM | POA: Diagnosis not present

## 2022-03-26 DIAGNOSIS — R1319 Other dysphagia: Secondary | ICD-10-CM | POA: Diagnosis not present

## 2022-03-26 DIAGNOSIS — K219 Gastro-esophageal reflux disease without esophagitis: Secondary | ICD-10-CM | POA: Insufficient documentation

## 2022-03-26 SURGERY — ESOPHAGOGASTRODUODENOSCOPY (EGD) WITH PROPOFOL
Anesthesia: Monitor Anesthesia Care

## 2022-03-30 DIAGNOSIS — Z79899 Other long term (current) drug therapy: Secondary | ICD-10-CM | POA: Diagnosis not present

## 2022-03-30 DIAGNOSIS — N529 Male erectile dysfunction, unspecified: Secondary | ICD-10-CM | POA: Diagnosis not present

## 2022-03-30 DIAGNOSIS — R7301 Impaired fasting glucose: Secondary | ICD-10-CM | POA: Diagnosis not present

## 2022-03-30 DIAGNOSIS — E785 Hyperlipidemia, unspecified: Secondary | ICD-10-CM | POA: Diagnosis not present

## 2022-04-06 DIAGNOSIS — K219 Gastro-esophageal reflux disease without esophagitis: Secondary | ICD-10-CM | POA: Diagnosis not present

## 2022-04-06 DIAGNOSIS — Z6824 Body mass index (BMI) 24.0-24.9, adult: Secondary | ICD-10-CM | POA: Diagnosis not present

## 2022-04-06 DIAGNOSIS — N529 Male erectile dysfunction, unspecified: Secondary | ICD-10-CM | POA: Diagnosis not present

## 2022-04-06 DIAGNOSIS — Z0000 Encounter for general adult medical examination without abnormal findings: Secondary | ICD-10-CM | POA: Diagnosis not present

## 2022-04-06 DIAGNOSIS — E785 Hyperlipidemia, unspecified: Secondary | ICD-10-CM | POA: Diagnosis not present

## 2022-04-07 ENCOUNTER — Encounter (INDEPENDENT_AMBULATORY_CARE_PROVIDER_SITE_OTHER): Payer: Self-pay

## 2022-07-01 DIAGNOSIS — Z23 Encounter for immunization: Secondary | ICD-10-CM | POA: Diagnosis not present

## 2022-07-24 IMAGING — DX DG CHEST 1V PORT
1 series · 1 of 1 positions shown · non-contrast
Comparison: 05/20/2015

CLINICAL DATA: Cough and congestion for several days

EXAM:
PORTABLE CHEST 1 VIEW

[chest pa]
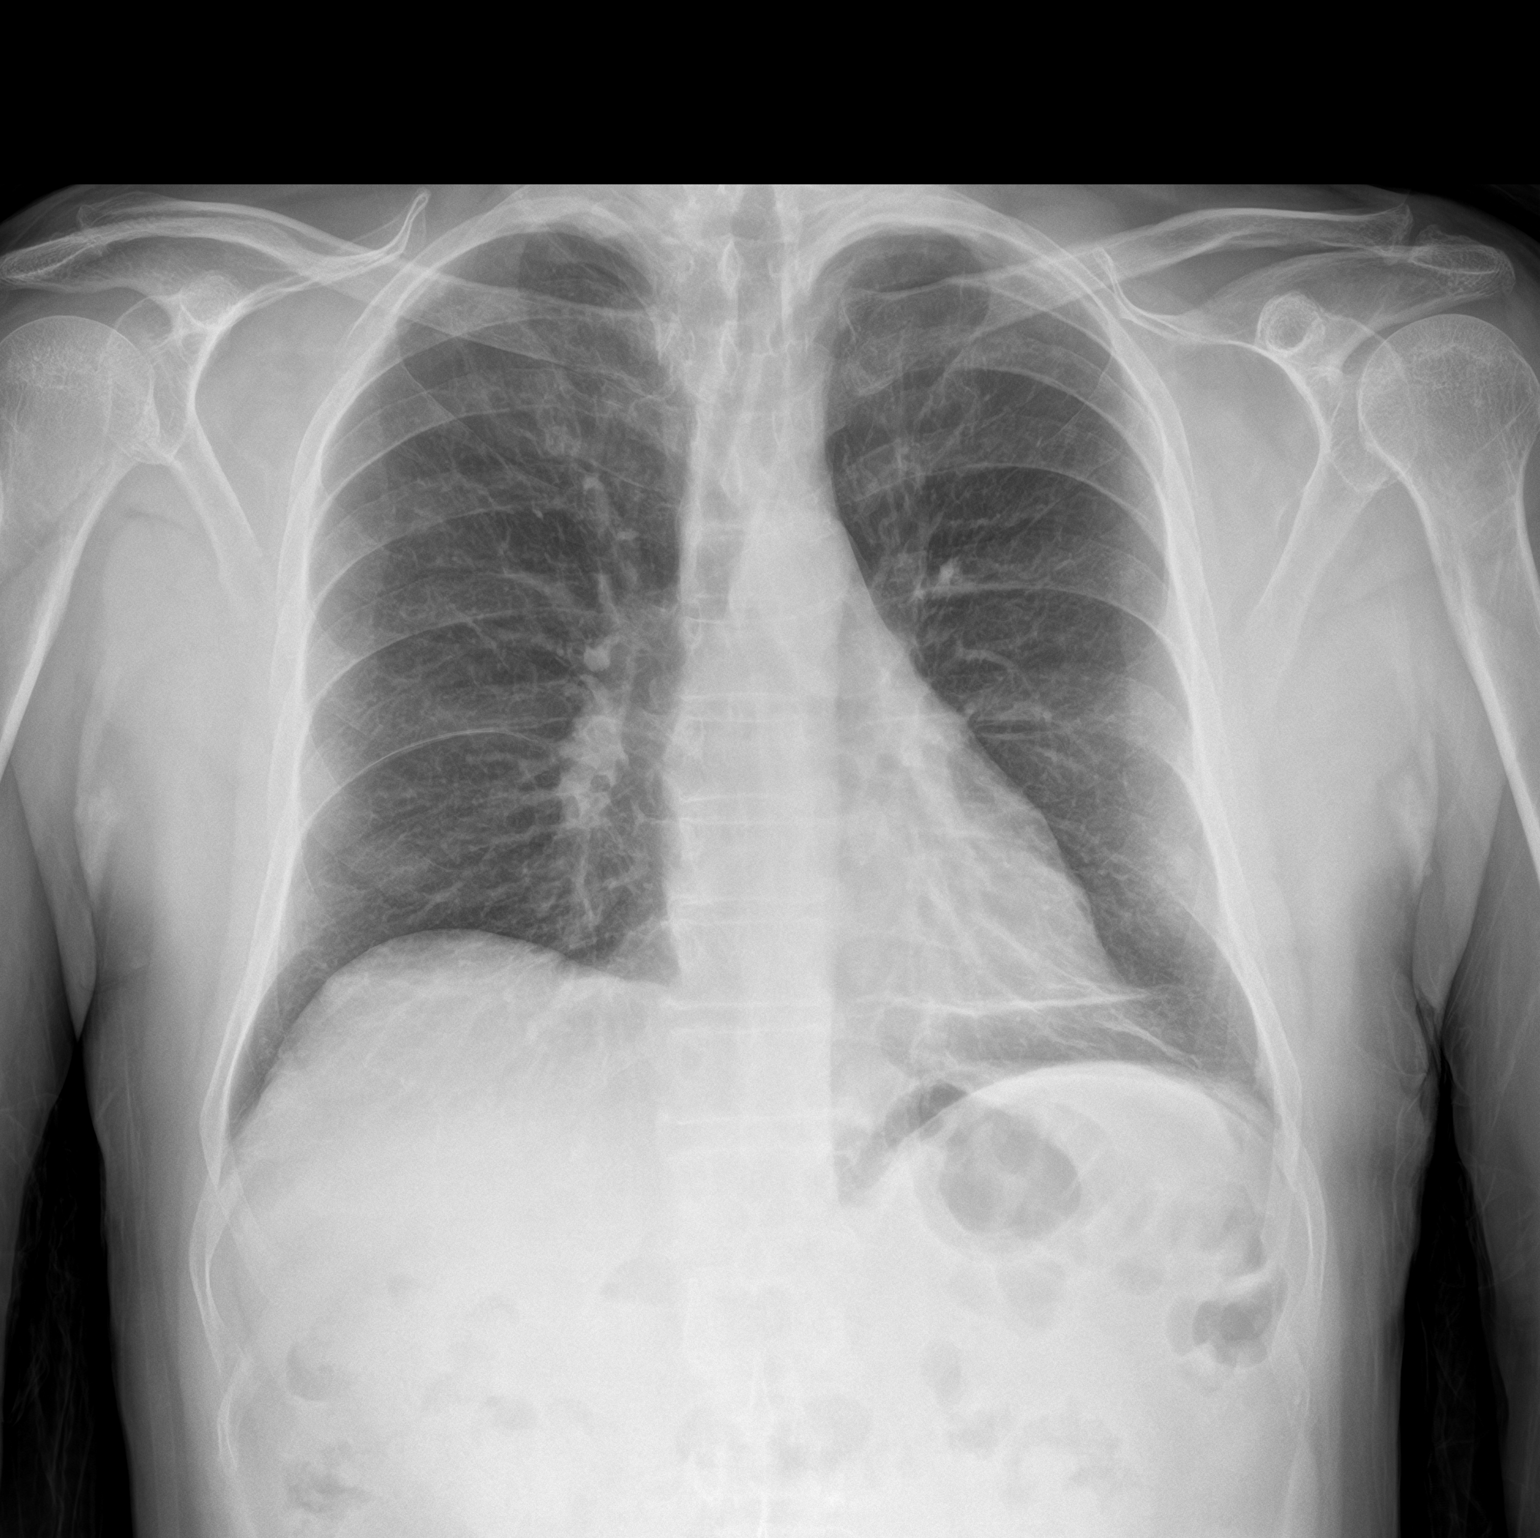

[1 of 1 positions shown; findings below may reference images not displayed]

FINDINGS: Cardiac shadow is stable. Mild platelike atelectasis is noted in the
left base. No sizable effusion is seen. No other infiltrate is
noted. No bony abnormality seen.
IMPRESSION: Left basilar atelectasis.  No other focal abnormality is noted.

## 2022-08-11 DIAGNOSIS — J342 Deviated nasal septum: Secondary | ICD-10-CM | POA: Diagnosis not present

## 2022-08-11 DIAGNOSIS — J343 Hypertrophy of nasal turbinates: Secondary | ICD-10-CM | POA: Diagnosis not present

## 2022-08-11 DIAGNOSIS — H6123 Impacted cerumen, bilateral: Secondary | ICD-10-CM | POA: Diagnosis not present

## 2022-08-11 DIAGNOSIS — G4733 Obstructive sleep apnea (adult) (pediatric): Secondary | ICD-10-CM | POA: Diagnosis not present

## 2022-08-11 DIAGNOSIS — J310 Chronic rhinitis: Secondary | ICD-10-CM | POA: Diagnosis not present

## 2022-09-28 DIAGNOSIS — H02052 Trichiasis without entropian right lower eyelid: Secondary | ICD-10-CM | POA: Diagnosis not present

## 2022-10-27 DIAGNOSIS — H43822 Vitreomacular adhesion, left eye: Secondary | ICD-10-CM | POA: Diagnosis not present

## 2022-10-27 DIAGNOSIS — H2513 Age-related nuclear cataract, bilateral: Secondary | ICD-10-CM | POA: Diagnosis not present

## 2022-10-27 DIAGNOSIS — H18513 Endothelial corneal dystrophy, bilateral: Secondary | ICD-10-CM | POA: Diagnosis not present
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
## 2022-12-17 DIAGNOSIS — H2512 Age-related nuclear cataract, left eye: Secondary | ICD-10-CM | POA: Diagnosis not present

## 2022-12-22 ENCOUNTER — Encounter (HOSPITAL_COMMUNITY)
Admission: RE | Admit: 2022-12-22 | Discharge: 2022-12-22 | Disposition: A | Discharge status: Home / Self Care | Payer: BC Managed Care – PPO | Source: Ambulatory Visit | Attending: Optometry | Admitting: Optometry

## 2022-12-22 ENCOUNTER — Other Ambulatory Visit: Payer: Self-pay

## 2022-12-22 ENCOUNTER — Encounter (HOSPITAL_COMMUNITY): Payer: Self-pay

## 2022-12-22 HISTORY — DX: Personal history of urinary calculi: Z87.442

## 2022-12-24 ENCOUNTER — Ambulatory Visit (HOSPITAL_COMMUNITY): Payer: BC Managed Care – PPO | Admitting: Anesthesiology

## 2022-12-24 ENCOUNTER — Encounter (HOSPITAL_COMMUNITY)
Admission: RE | Disposition: A | Discharge status: Home / Self Care | Payer: Self-pay | Source: Home / Self Care | Attending: Optometry

## 2022-12-24 ENCOUNTER — Other Ambulatory Visit: Payer: Self-pay

## 2022-12-24 ENCOUNTER — Ambulatory Visit (HOSPITAL_COMMUNITY)
Admission: RE | Admit: 2022-12-24 | Discharge: 2022-12-24 | Disposition: A | Discharge status: Home / Self Care | Payer: BC Managed Care – PPO | Attending: Optometry | Admitting: Optometry

## 2022-12-24 ENCOUNTER — Encounter (HOSPITAL_COMMUNITY): Payer: Self-pay | Admitting: Optometry

## 2022-12-24 DIAGNOSIS — H2512 Age-related nuclear cataract, left eye: Secondary | ICD-10-CM | POA: Diagnosis not present

## 2022-12-24 DIAGNOSIS — H43822 Vitreomacular adhesion, left eye: Secondary | ICD-10-CM | POA: Insufficient documentation

## 2022-12-24 DIAGNOSIS — K219 Gastro-esophageal reflux disease without esophagitis: Secondary | ICD-10-CM | POA: Diagnosis not present

## 2022-12-24 DIAGNOSIS — H52212 Irregular astigmatism, left eye: Secondary | ICD-10-CM | POA: Diagnosis not present

## 2022-12-24 DIAGNOSIS — H18513 Endothelial corneal dystrophy, bilateral: Secondary | ICD-10-CM | POA: Diagnosis not present

## 2022-12-24 DIAGNOSIS — H5203 Hypermetropia, bilateral: Secondary | ICD-10-CM | POA: Insufficient documentation

## 2022-12-24 DIAGNOSIS — Z09 Encounter for follow-up examination after completed treatment for conditions other than malignant neoplasm: Secondary | ICD-10-CM | POA: Insufficient documentation

## 2022-12-24 DIAGNOSIS — Z87891 Personal history of nicotine dependence: Secondary | ICD-10-CM | POA: Insufficient documentation

## 2022-12-24 HISTORY — PX: CATARACT EXTRACTION W/PHACO: SHX586

## 2022-12-24 SURGERY — PHACOEMULSIFICATION, CATARACT, WITH IOL INSERTION
Anesthesia: Monitor Anesthesia Care | Site: Eye | Laterality: Left

## 2022-12-24 MED ORDER — TROPICAMIDE 1 % OP SOLN
1.0000 [drp] | OPHTHALMIC | Status: AC | PRN
Start: 2022-12-24 — End: 2022-12-24
  Administered 2022-12-24 (×3): 1 [drp] via OPHTHALMIC

## 2022-12-24 MED ORDER — MIDAZOLAM HCL 2 MG/2ML IJ SOLN
INTRAMUSCULAR | Status: AC
  Filled 2022-12-24: qty 2

## 2022-12-24 MED ORDER — PHENYLEPHRINE HCL 2.5 % OP SOLN
1.0000 [drp] | OPHTHALMIC | Status: AC | PRN
  Administered 2022-12-24 (×3): 1 [drp] via OPHTHALMIC

## 2022-12-24 MED ORDER — SODIUM CHLORIDE 0.9% FLUSH
INTRAVENOUS | Status: DC | PRN
  Administered 2022-12-24: 5 mL via INTRAVENOUS

## 2022-12-24 MED ORDER — NEOMYCIN-POLYMYXIN-DEXAMETH 3.5-10000-0.1 OP SUSP
OPHTHALMIC | Status: DC | PRN
  Administered 2022-12-24: 2 [drp] via OPHTHALMIC

## 2022-12-24 MED ORDER — LACTATED RINGERS IV SOLN: INTRAVENOUS | Status: DC

## 2022-12-24 MED ORDER — SIGHTPATH DOSE#1 NA HYALUR & NA CHOND-NA HYALUR IO KIT
PACK | INTRAOCULAR | Status: DC | PRN
  Administered 2022-12-24: 1 via OPHTHALMIC

## 2022-12-24 MED ORDER — LIDOCAINE HCL 3.5 % OP GEL
1.0000 | Freq: Once | OPHTHALMIC | Status: AC
  Administered 2022-12-24: 1 via OPHTHALMIC

## 2022-12-24 MED ORDER — POVIDONE-IODINE 5 % OP SOLN
OPHTHALMIC | Status: DC | PRN
  Administered 2022-12-24: 1 via OPHTHALMIC

## 2022-12-24 MED ORDER — PHENYLEPHRINE-KETOROLAC 1-0.3 % IO SOLN
INTRAOCULAR | Status: DC | PRN
  Administered 2022-12-24: 500 mL via OPHTHALMIC

## 2022-12-24 MED ORDER — MIDAZOLAM HCL 2 MG/2ML IJ SOLN
INTRAMUSCULAR | Status: DC | PRN
  Administered 2022-12-24: 1 mg via INTRAVENOUS

## 2022-12-24 MED ORDER — BSS IO SOLN
INTRAOCULAR | Status: DC | PRN
  Administered 2022-12-24: 15 mL via INTRAOCULAR

## 2022-12-24 MED ORDER — LIDOCAINE HCL (PF) 1 % IJ SOLN
INTRAMUSCULAR | Status: DC | PRN
  Administered 2022-12-24: 2 mL

## 2022-12-24 MED ORDER — TETRACAINE HCL 0.5 % OP SOLN
1.0000 [drp] | OPHTHALMIC | Status: AC | PRN
  Administered 2022-12-24 (×3): 1 [drp] via OPHTHALMIC

## 2022-12-24 MED ORDER — STERILE WATER FOR IRRIGATION IR SOLN
Status: DC | PRN
  Administered 2022-12-24: 250 mL

## 2022-12-24 SURGICAL SUPPLY — 14 items

## 2022-12-24 NOTE — Interval H&P Note (Signed)
History and Physical Interval Note:  12/24/2022 11:15 AM  The H and P was reviewed and updated. The patient was examined.  No changes were found after exam.  The surgical eye was marked.  Tima Curet

## 2022-12-24 NOTE — Transfer of Care (Signed)
Immediate Anesthesia Transfer of Care Note  Patient: James Cline  Procedure(s) Performed: CATARACT EXTRACTION PHACO AND INTRAOCULAR LENS PLACEMENT (IOC) (Left: Eye)  Patient Location: Short Stay  Anesthesia Type:MAC  Level of Consciousness: awake, alert , and oriented  Airway & Oxygen Therapy: Patient Spontanous Breathing  Post-op Assessment: Report given to RN and Post -op Vital signs reviewed and stable  Post vital signs: Reviewed and stable  Last Vitals:  Vitals Value Taken Time  BP    Temp    Pulse    Resp    SpO2      Last Pain:  Vitals:   12/24/22 1120  TempSrc: Oral  PainSc: 0-No pain         Complications: No notable events documented.

## 2022-12-24 NOTE — Anesthesia Procedure Notes (Signed)
Procedure Name: MAC Date/Time: 12/24/2022 12:26 PM  Performed by: Julian Reil, CRNAPre-anesthesia Checklist: Patient identified, Emergency Drugs available, Suction available and Patient being monitored Patient Re-evaluated:Patient Re-evaluated prior to induction Oxygen Delivery Method: Nasal cannula Placement Confirmation: positive ETCO2

## 2022-12-24 NOTE — Anesthesia Preprocedure Evaluation (Signed)
Anesthesia Evaluation  Patient identified by MRN, date of birth, ID band Patient awake    Reviewed: Allergy & Precautions, H&P , NPO status , Patient's Chart, lab work & pertinent test results  Airway Mallampati: II  TM Distance: >3 FB Neck ROM: Full    Dental  (+) Upper Dentures, Lower Dentures   Pulmonary neg pulmonary ROS, former smoker   Pulmonary exam normal breath sounds clear to auscultation       Cardiovascular negative cardio ROS Normal cardiovascular exam Rhythm:Regular Rate:Normal     Neuro/Psych negative neurological ROS  negative psych ROS   GI/Hepatic Neg liver ROS,GERD  Medicated and Controlled,,  Endo/Other  negative endocrine ROS    Renal/GU negative Renal ROS  negative genitourinary   Musculoskeletal negative musculoskeletal ROS (+)    Abdominal   Peds negative pediatric ROS (+)  Hematology negative hematology ROS (+)   Anesthesia Other Findings   Reproductive/Obstetrics negative OB ROS                             Anesthesia Physical Anesthesia Plan  ASA: 2  Anesthesia Plan: MAC   Post-op Pain Management: Minimal or no pain anticipated   Induction: Intravenous  PONV Risk Score and Plan:   Airway Management Planned: Nasal Cannula and Natural Airway  Additional Equipment:   Intra-op Plan:   Post-operative Plan:   Informed Consent: I have reviewed the patients History and Physical, chart, labs and discussed the procedure including the risks, benefits and alternatives for the proposed anesthesia with the patient or authorized representative who has indicated his/her understanding and acceptance.     Dental advisory given  Plan Discussed with: CRNA and Surgeon  Anesthesia Plan Comments:        Anesthesia Quick Evaluation

## 2022-12-24 NOTE — Anesthesia Postprocedure Evaluation (Signed)
Anesthesia Post Note  Patient: DAIMON SARTORIUS  Procedure(s) Performed: CATARACT EXTRACTION PHACO AND INTRAOCULAR LENS PLACEMENT (IOC) (Left: Eye)  Patient location during evaluation: Phase II Anesthesia Type: MAC Level of consciousness: awake and alert and oriented Pain management: pain level controlled Vital Signs Assessment: post-procedure vital signs reviewed and stable Respiratory status: spontaneous breathing, nonlabored ventilation and respiratory function stable Cardiovascular status: blood pressure returned to baseline and stable Postop Assessment: no apparent nausea or vomiting Anesthetic complications: no  No notable events documented.   Last Vitals:  Vitals:   12/24/22 1120 12/24/22 1245  BP: (!) 164/90 (!) 160/90  Pulse: 75 77  Resp: 20 17  Temp: 36.6 C 36.6 C  SpO2: 98% 98%    Last Pain:  Vitals:   12/24/22 1245  TempSrc: Oral  PainSc: 0-No pain                 Christina Gintz C Joseluis Alessio

## 2022-12-28 DIAGNOSIS — G4730 Sleep apnea, unspecified: Secondary | ICD-10-CM | POA: Diagnosis not present

## 2022-12-29 NOTE — Op Note (Signed)
Date of procedure: 12/29/22  Pre-operative diagnosis: Visually significant age-related nuclear cataract, Left Eye (H25.12)  Post-operative diagnosis: Visually significant age-related nuclear cataract, Left Eye  Procedure: Removal of cataract via phacoemulsification and insertion of intra-ocular lens J&J DIB00 +22.5D into the capsular bag of the Left Eye  Attending surgeon: Ronal Fear, MD  Anesthesia: MAC, Topical Akten  Complications: None  Estimated Blood Loss: <57mL (minimal)  Specimens: None  Implants:  Implant Name Type Inv. Item Serial No. Manufacturer Lot No. LRB No. Used Action  LENS IOL TECNIS EYHANCE 22.5 - [id number] Intraocular Lens LENS IOL TECNIS EYHANCE 22.5 [id number] [employer]  Left 1 Implanted    Indications:  Visually significant age-related cataract, Left Eye  Procedure:  The patient was seen and identified in the pre-operative area. The operative eye was identified and dilated.  The operative eye was marked.  Topical anesthesia was administered to the operative eye.     The patient was then to the operative suite and placed in the supine position.  A timeout was performed confirming the patient, procedure to be performed, and all other relevant information.   The patient's face was prepped and draped in the usual fashion for intra-ocular surgery.  A lid speculum was placed into the operative eye and the surgical microscope moved into place and focused.  An inferotemporal paracentesis was created using a 20 gauge paracentesis blade.  BSS mixed with Omidria, followed by 1% lidocaine was injected into the anterior chamber.  Viscoelastic was injected into the anterior chamber.  A temporal clear-corneal main wound incision was created using a 2.108mm microkeratome.  A continuous curvilinear capsulorrhexis was initiated using an irrigating cystitome and completed using capsulorrhexis forceps.  Hydrodissection and hydrodeliniation were performed.  Viscoelastic was  injected into the anterior chamber.  A phacoemulsification handpiece and a chopper as a second instrument were used to remove the nucleus and epinucleus. The irrigation/aspiration handpiece was used to remove any remaining cortical material.   The capsular bag was reinflated with viscoelastic, checked, and found to be intact.  The intraocular lens was inserted into the capsular bag.  The irrigation/aspiration handpiece was used to remove any remaining viscoelastic.  The clear corneal wound and paracentesis wounds were then hydrated and checked with Weck-Cels to be watertight.  The lid-speculum and drape was removed, and the patient's face was cleaned with a wet and dry 4x4.  Maxitrol drops were instilled onto the eye. A clear shield was taped over the eye. The patient was taken to the post-operative care unit in good condition, having tolerated the procedure well.  Post-Op Instructions: The patient will follow up at [facility] for a same day post-operative evaluation and will receive all other orders and instructions.

## 2022-12-31 ENCOUNTER — Encounter (HOSPITAL_COMMUNITY): Payer: Self-pay | Admitting: Optometry

## 2023-01-05 NOTE — H&P (Signed)
Surgical History & Physical  Patient Name: James Cline  DOB: 03/20/1946  Surgery: Cataract extraction with intraocular lens implant phacoemulsification; Right Eye Surgeon: Pecolia Ades MD Surgery Date: 01/14/2023 Pre-Op Date: 12/29/2022  HPI: A 84 Yr. old male patient present for 5 day post op OS. Patient is doing well. Using Ciprofloxacin and Prednisolone QID OS. Difficulties driving especially at night trying to see the road signs. Difficulties reading fine print like on medicine bottles. This is negatively affecting the patient's quality of life and the patient is unable to function adequately in life with the current level of vision. Patient would like to proceed with cataract sx OD.  Medical History: Cataracts trichiasis LDL  Review of Systems Negative Allergic/Immunologic Negative Cardiovascular Negative Constitutional Negative Ear, Nose, Mouth & Throat Negative Endocrine Negative Eyes Negative Gastrointestinal Negative Genitourinary Negative Hemotologic/Lymphatic Negative Integumentary Negative Musculoskeletal Negative Neurological Negative Psychiatry Negative Respiratory  Social Never smoked   Medication Ciprofloxacin, Prednisolone acetate 1%,  Simvastatin  Sx/Procedures Laser destruction of eye lash follicle, Phaco c IOL OS,  Hernia Sx   Drug Allergies  NKDA  History & Physical: Heent: PCL OS, cataract OD NECK: supple without bruits LUNGS: lungs clear to auscultation CV: regular rate and rhythm Abdomen: soft and non-tender  Impression & Plan: Assessment: 1.  CATARACT EXTRACTION STATUS; Left Eye (Z98.42) 2.  INTRAOCULAR LENS IOL ; Left Eye (Z96.1) 3.  CATARACT AGE-RELATED NUCLEAR; , Right Eye (H25.11) 4.  TRICHIASIS NO ENTROPION; Right Lower Lid (H02.052)  Plan: 1.  POD5 exam. Doing well. All post-op precautions discussed and instructions reviewed. Written instructions given.  2.  Cataracts are visually significant and account for the  patient  3.  Cataracts are visually significant and account for the patient's complaints. Discussed all risks, benefits, procedures and recovery, including infection, loss of vision and eye, need for glasses after surgery or additional procedures. Patient understands changing glasses will not improve vision. Patient indicated understanding of procedure. All questions answered. Patient desires to have surgery, recommend phacoemulsification with intraocular lens. Patient to have preliminary testing necessary (Argos/IOL Master, Mac OCT, TOPO) Educational materials provided.  Plan: - Proceed with cataract surgery OD - DIB00 lens -0.25 target - ok with lying flat - no prior eye surgeries - trace guttae  4.  3 lashes removed today, has been having these removed every 2 months by Dr. Charise Killian.

## 2023-01-07 ENCOUNTER — Encounter (HOSPITAL_COMMUNITY)
Admission: RE | Admit: 2023-01-07 | Discharge: 2023-01-07 | Disposition: A | Discharge status: Home / Self Care | Payer: BC Managed Care – PPO | Source: Ambulatory Visit | Attending: Optometry | Admitting: Optometry

## 2023-01-07 DIAGNOSIS — H2511 Age-related nuclear cataract, right eye: Secondary | ICD-10-CM | POA: Diagnosis not present

## 2023-01-14 ENCOUNTER — Encounter (HOSPITAL_COMMUNITY)
Admission: RE | Disposition: A | Discharge status: Home / Self Care | Payer: Self-pay | Source: Home / Self Care | Attending: Optometry

## 2023-01-14 ENCOUNTER — Ambulatory Visit (HOSPITAL_COMMUNITY): Payer: BC Managed Care – PPO | Admitting: Anesthesiology

## 2023-01-14 ENCOUNTER — Ambulatory Visit (HOSPITAL_COMMUNITY)
Admission: RE | Admit: 2023-01-14 | Discharge: 2023-01-14 | Disposition: A | Discharge status: Home / Self Care | Payer: BC Managed Care – PPO | Attending: Optometry | Admitting: Optometry

## 2023-01-14 DIAGNOSIS — Z09 Encounter for follow-up examination after completed treatment for conditions other than malignant neoplasm: Secondary | ICD-10-CM | POA: Diagnosis not present

## 2023-01-14 DIAGNOSIS — Z87891 Personal history of nicotine dependence: Secondary | ICD-10-CM | POA: Diagnosis not present

## 2023-01-14 DIAGNOSIS — H02052 Trichiasis without entropian right lower eyelid: Secondary | ICD-10-CM | POA: Diagnosis not present

## 2023-01-14 DIAGNOSIS — K219 Gastro-esophageal reflux disease without esophagitis: Secondary | ICD-10-CM | POA: Insufficient documentation

## 2023-01-14 DIAGNOSIS — H2511 Age-related nuclear cataract, right eye: Secondary | ICD-10-CM | POA: Diagnosis not present

## 2023-01-14 HISTORY — PX: CATARACT EXTRACTION W/PHACO: SHX586

## 2023-01-14 SURGERY — PHACOEMULSIFICATION, CATARACT, WITH IOL INSERTION
Anesthesia: Monitor Anesthesia Care | Site: Eye | Laterality: Right

## 2023-01-14 MED ORDER — PHENYLEPHRINE-KETOROLAC 1-0.3 % IO SOLN
INTRAOCULAR | Status: DC | PRN
  Administered 2023-01-14: 500 mL via OPHTHALMIC

## 2023-01-14 MED ORDER — PHENYLEPHRINE HCL 2.5 % OP SOLN
1.0000 [drp] | OPHTHALMIC | Status: AC
  Administered 2023-01-14 (×3): 1 [drp] via OPHTHALMIC

## 2023-01-14 MED ORDER — SODIUM CHLORIDE 0.9% FLUSH
INTRAVENOUS | Status: DC | PRN
  Administered 2023-01-14 (×2): 5 mL via INTRAVENOUS

## 2023-01-14 MED ORDER — ONDANSETRON HCL 4 MG/2ML IJ SOLN
INTRAMUSCULAR | Status: DC | PRN
  Administered 2023-01-14: 4 mg via INTRAVENOUS

## 2023-01-14 MED ORDER — ONDANSETRON HCL 4 MG/2ML IJ SOLN
INTRAMUSCULAR | Status: AC
  Filled 2023-01-14: qty 2

## 2023-01-14 MED ORDER — MIDAZOLAM HCL 2 MG/2ML IJ SOLN
INTRAMUSCULAR | Status: AC
  Filled 2023-01-14: qty 2

## 2023-01-14 MED ORDER — STERILE WATER FOR IRRIGATION IR SOLN
Status: DC | PRN
  Administered 2023-01-14: 250 mL

## 2023-01-14 MED ORDER — BSS IO SOLN
INTRAOCULAR | Status: DC | PRN
  Administered 2023-01-14: 15 mL via INTRAOCULAR

## 2023-01-14 MED ORDER — TETRACAINE HCL 0.5 % OP SOLN
1.0000 [drp] | OPHTHALMIC | Status: AC
  Administered 2023-01-14 (×3): 1 [drp] via OPHTHALMIC

## 2023-01-14 MED ORDER — LIDOCAINE HCL 3.5 % OP GEL
1.0000 | Freq: Once | OPHTHALMIC | Status: AC
  Administered 2023-01-14: 1 via OPHTHALMIC

## 2023-01-14 MED ORDER — FENTANYL CITRATE (PF) 100 MCG/2ML IJ SOLN
INTRAMUSCULAR | Status: DC | PRN
  Administered 2023-01-14: 50 ug via INTRAVENOUS

## 2023-01-14 MED ORDER — TROPICAMIDE 1 % OP SOLN
1.0000 [drp] | OPHTHALMIC | Status: AC
  Administered 2023-01-14 (×3): 1 [drp] via OPHTHALMIC

## 2023-01-14 MED ORDER — LIDOCAINE HCL (PF) 1 % IJ SOLN
INTRAMUSCULAR | Status: DC | PRN
  Administered 2023-01-14: 2 mL

## 2023-01-14 MED ORDER — NEOMYCIN-POLYMYXIN-DEXAMETH 3.5-10000-0.1 OP SUSP
OPHTHALMIC | Status: DC | PRN
  Administered 2023-01-14: 2 [drp] via OPHTHALMIC

## 2023-01-14 MED ORDER — FENTANYL CITRATE (PF) 100 MCG/2ML IJ SOLN
INTRAMUSCULAR | Status: AC
  Filled 2023-01-14: qty 2

## 2023-01-14 MED ORDER — MIDAZOLAM HCL 2 MG/2ML IJ SOLN
INTRAMUSCULAR | Status: DC | PRN
  Administered 2023-01-14: 1 mg via INTRAVENOUS

## 2023-01-14 MED ORDER — POVIDONE-IODINE 5 % OP SOLN
OPHTHALMIC | Status: DC | PRN
  Administered 2023-01-14: 1 via OPHTHALMIC

## 2023-01-14 MED ORDER — SIGHTPATH DOSE#1 NA HYALUR & NA CHOND-NA HYALUR IO KIT
PACK | INTRAOCULAR | Status: DC | PRN
  Administered 2023-01-14: 1 via OPHTHALMIC

## 2023-01-14 SURGICAL SUPPLY — 14 items

## 2023-01-14 NOTE — Interval H&P Note (Signed)
History and Physical Interval Note:  01/14/2023 7:31 AM  The H and P was reviewed and updated. The patient was examined.  No changes were found after exam.  The surgical eye was marked.  James Cline

## 2023-01-14 NOTE — Op Note (Signed)
Date of procedure: 01/14/23  Pre-operative diagnosis: Visually significant age-related nuclear cataract, Right Eye (H25.11)  Post-operative diagnosis: Visually significant age-related nuclear cataract, Right Eye  Procedure: Removal of cataract via phacoemulsification and insertion of intra-ocular lens Rayner &J DIBOO +24.0D into the capsular bag of the Right Eye  Attending surgeon: Pecolia Ades, MD  Anesthesia: MAC, Topical Akten  Complications: None  Estimated Blood Loss: <40mL (minimal)  Specimens: None  Implants:  Implant Name Type Inv. Item Serial No. Manufacturer Lot No. LRB No. Used Action  LENS IOL TECNIS EYHANCE 24.0 - [id number] Intraocular Lens LENS IOL TECNIS EYHANCE 24.0 [id number] [employer]  Right 1 Implanted    Indications:  Visually significant age-related cataract, Right Eye  Procedure:  The patient was seen and identified in the pre-operative area. The operative eye was identified and dilated.  The operative eye was marked.  Topical anesthesia was administered to the operative eye.     The patient was then to the operative suite and placed in the supine position.  A timeout was performed confirming the patient, procedure to be performed, and all other relevant information.   The patient's face was prepped and draped in the usual fashion for intra-ocular surgery.  A lid speculum was placed into the operative eye and the surgical microscope moved into place and focused.  A superotemporal paracentesis was created using a 20 gauge paracentesis blade.  BSS mixed with Omidria, followed by 1% lidocaine was injected into the anterior chamber.  Viscoelastic was injected into the anterior chamber.  A temporal clear-corneal main wound incision was created using a 2.91mm microkeratome.  A continuous curvilinear capsulorrhexis was initiated using an irrigating cystitome and completed using capsulorrhexis forceps.  Hydrodissection and hydrodeliniation were performed.  Viscoelastic  was injected into the anterior chamber.  A phacoemulsification handpiece and a chopper as a second instrument were used to remove the nucleus and epinucleus. The irrigation/aspiration handpiece was used to remove any remaining cortical material.   The capsular bag was reinflated with viscoelastic, checked, and found to be intact.  The intraocular lens was inserted into the capsular bag.  The irrigation/aspiration handpiece was used to remove any remaining viscoelastic.  The clear corneal wound and paracentesis wounds were then hydrated and checked with Weck-Cels to be watertight.  The lid-speculum and drape was removed, and the patient's face was cleaned with a wet and dry 4x4.  Maxitrol drops were instilled onto the eye. A clear shield was taped over the eye. The patient was taken to the post-operative care unit in good condition, having tolerated the procedure well.  Post-Op Instructions: The patient will follow up at [facility] for a same day post-operative evaluation and will receive all other orders and instructions.

## 2023-01-14 NOTE — Anesthesia Preprocedure Evaluation (Signed)
Anesthesia Evaluation  Patient identified by MRN, date of birth, ID band Patient awake    Reviewed: Allergy & Precautions, H&P , NPO status , Patient's Chart, lab work & pertinent test results  Airway Mallampati: II  TM Distance: >3 FB Neck ROM: Full    Dental  (+) Upper Dentures, Lower Dentures   Pulmonary neg pulmonary ROS, former smoker   Pulmonary exam normal breath sounds clear to auscultation       Cardiovascular negative cardio ROS Normal cardiovascular exam Rhythm:Regular Rate:Normal     Neuro/Psych negative neurological ROS  negative psych ROS   GI/Hepatic Neg liver ROS,GERD  Medicated and Controlled,,  Endo/Other  negative endocrine ROS    Renal/GU negative Renal ROS  negative genitourinary   Musculoskeletal negative musculoskeletal ROS (+)    Abdominal   Peds negative pediatric ROS (+)  Hematology negative hematology ROS (+)   Anesthesia Other Findings   Reproductive/Obstetrics negative OB ROS                             Anesthesia Physical Anesthesia Plan  ASA: 2  Anesthesia Plan: MAC   Post-op Pain Management: Minimal or no pain anticipated   Induction: Intravenous  PONV Risk Score and Plan:   Airway Management Planned: Nasal Cannula and Natural Airway  Additional Equipment:   Intra-op Plan:   Post-operative Plan:   Informed Consent: I have reviewed the patients History and Physical, chart, labs and discussed the procedure including the risks, benefits and alternatives for the proposed anesthesia with the patient or authorized representative who has indicated his/her understanding and acceptance.     Dental advisory given  Plan Discussed with: CRNA and Surgeon  Anesthesia Plan Comments:        Anesthesia Quick Evaluation  

## 2023-01-14 NOTE — Transfer of Care (Addendum)
Immediate Anesthesia Transfer of Care Note  Patient: James Cline  Procedure(s) Performed: CATARACT EXTRACTION PHACO AND INTRAOCULAR LENS PLACEMENT (IOC) (Right: Eye)  Patient Location: PACU and Short Stay  Anesthesia Type:MAC  Level of Consciousness: awake and patient cooperative  Airway & Oxygen Therapy: Patient Spontanous Breathing  Post-op Assessment: Report given to RN and Post -op Vital signs reviewed and stable  Post vital signs: Reviewed and stable  Last Vitals:  Vitals Value Taken Time  BP 162/71 01/14/2023     0842  Temp 36.6 01/14/2023     0842  Pulse 75 01/14/2023     0842  Resp 16 01/14/2023     0842  SpO2 96% 01/14/2023     0842    Last Pain:  Vitals:   01/14/23 0712  PainSc: 0-No pain         Complications: No notable events documented.

## 2023-01-14 NOTE — Anesthesia Postprocedure Evaluation (Signed)
Anesthesia Post Note  Patient: James Cline  Procedure(s) Performed: CATARACT EXTRACTION PHACO AND INTRAOCULAR LENS PLACEMENT (IOC) (Right: Eye)  Patient location during evaluation: Phase II Anesthesia Type: MAC Level of consciousness: awake and alert and oriented Pain management: pain level controlled Vital Signs Assessment: post-procedure vital signs reviewed and stable Respiratory status: spontaneous breathing, nonlabored ventilation and respiratory function stable Cardiovascular status: blood pressure returned to baseline and stable Postop Assessment: no apparent nausea or vomiting Anesthetic complications: no  No notable events documented.   Last Vitals:  Vitals:   01/14/23 0715 01/14/23 0842  BP: (!) 182/92 (!) 162/71  Pulse: 88 75  Resp: 16 16  Temp:  36.6 C  SpO2: 100% 96%    Last Pain:  Vitals:   01/14/23 0842  TempSrc: Oral  PainSc: 0-No pain                 Chaise Mahabir C [name]

## 2023-01-14 NOTE — Discharge Instructions (Signed)
Please discharge patient when stable, will follow up today with Dr. Michaelah Credeur at the [facility] [place] office immediately following discharge.  Leave shield in place until visit.  All paperwork with discharge instructions will be given at the office.  [facility] Address:  [address]  [place], [place] [address]  Dr. Danyal Whitenack's Phone: [phone]  

## 2023-01-18 DIAGNOSIS — H59032 Cystoid macular edema following cataract surgery, left eye: Secondary | ICD-10-CM | POA: Diagnosis not present

## 2023-01-19 ENCOUNTER — Encounter (HOSPITAL_COMMUNITY): Payer: Self-pay | Admitting: Optometry

## 2023-02-01 DIAGNOSIS — H43822 Vitreomacular adhesion, left eye: Secondary | ICD-10-CM | POA: Diagnosis not present

## 2023-02-15 DIAGNOSIS — H43822 Vitreomacular adhesion, left eye: Secondary | ICD-10-CM | POA: Diagnosis not present

## 2023-02-21 ENCOUNTER — Ambulatory Visit (HOSPITAL_BASED_OUTPATIENT_CLINIC_OR_DEPARTMENT_OTHER): Payer: BC Managed Care – PPO | Admitting: Pulmonary Disease

## 2023-02-21 ENCOUNTER — Encounter (HOSPITAL_BASED_OUTPATIENT_CLINIC_OR_DEPARTMENT_OTHER): Payer: Self-pay | Admitting: Pulmonary Disease

## 2023-02-21 VITALS — BP 138/76 | HR 86 | Resp 11 | Ht 65.0 in | Wt 152.4 lb

## 2023-02-21 DIAGNOSIS — G4733 Obstructive sleep apnea (adult) (pediatric): Secondary | ICD-10-CM

## 2023-02-21 DIAGNOSIS — Z7189 Other specified counseling: Secondary | ICD-10-CM

## 2023-02-21 NOTE — Patient Instructions (Signed)
Will arrange for auto CPAP set up  Follow up in 4 months 

## 2023-02-21 NOTE — Progress Notes (Signed)
[facility]  Chief Complaint  Patient presents with   New Patient (Initial Visit)    Discuss  sleep study , possible  sleep apnea     Past Surgical History:  He  has a past surgical history that includes Hernia repair (Right, 1998); Colonoscopy; Colonoscopy (N/A, 10/23/2014); Cataract extraction w/PHACO (Left, 12/24/2022); and Cataract extraction w/PHACO (Right, 01/14/2023).  Past Medical History:  HLD, Nephrolithiasis, Deviated nasal septum  Constitutional:  BP 138/76   Pulse 86   Resp 11   Ht 5\' 5"  (1.651 m)   Wt 152 lb 6.4 oz (69.1 kg)   SpO2 97%   BMI 25.36 kg/m   Brief Summary:  James Cline is a 77 y.o. male with obstructive sleep apnea.      Subjective:   He is here with his wife.  She has been worried about his snoring for years.  This has been getting worse.  She has to sleep in a separate room.  He will make loud snoring noises and then have periods when his breathing gets less noisy.  This happens more when he is on his back.  He was seen by Dr. Suszanne Conners with ENT for deviated nasal septum, and was advised to have a sleep study done first.  He had a home sleep study that showed mild sleep apnea.  He does have trouble staying awake in the late evening, and gets up a couple times a night.  He goes to sleep between 10 and 11 pm.  He falls asleep in 5 to 10 minutes.  He wakes up one or two times to use the bathroom.  He gets out of bed at 520 am to go to work.  He feels okay in the morning.  He denies morning headache.  He does not use anything to help him fall sleep.  Drinks coffee in the morning.  Occasional gets a cramp in his hamstring while asleep, but this can be severe when it happens.  Has been taking magnesium supplement.  He denies sleep walking, sleep talking, bruxism, or nightmares.  There is no history of restless legs.  He denies sleep hallucinations, sleep paralysis, or cataplexy.  The Epworth score is 3 out of  24.   Physical Exam:   Appearance - well kempt   ENMT - no sinus tenderness, no oral exudate, no LAN, Mallampati 2 airway, no stridor, wears dentures, decreased AP diameter, deviated nasal septum  Respiratory - equal breath sounds bilaterally, no wheezing or rales  CV - s1s2 regular rate and rhythm, no murmurs  Ext - no clubbing, no edema  Skin - no rashes  Psych - normal mood and affect   Sleep Tests:  HST 12/28/22 >> AHI 8.8, SpO2 low 84%  Social History:  He  reports that he quit smoking about 60 years ago. His smoking use included cigarettes. He started smoking about 75 years ago. He has a 3.8 pack-year smoking history. He has been exposed to tobacco smoke. He has never used smokeless tobacco. He reports that he does not drink alcohol and does not use drugs.  Family History:  His family history is not on file.     Assessment/Plan:   Obstructive sleep apnea. - reviewed his sleep study results - discussed how sleep apnea can impact his health and driving precautions reviewed - treatment options discussed - will arrange for auto CPAP set up - discussed techniques to improve adjusting to CPAP therapy  Deviated nasal  septum. - previously seen by Dr. Suszanne Conners with ENT  Time Spent Involved in Patient Care on Day of Examination:  37 minutes  Follow up:   Patient Instructions  Will arrange for auto CPAP set up  Follow up in 4 months  Medication List:   Allergies as of 02/21/2023       Reactions   Fentanyl Nausea And Vomiting   Patient reports sensitivity to fentanyl. Noted during cataract surgery -01/14/2023.        Medication List        Accurate as of February 21, 2023  4:32 PM. If you have any questions, ask your nurse or doctor.          ciprofloxacin 0.3 % ophthalmic solution Commonly known as: CILOXAN SMARTSIG:In Eye(s)   ketorolac 0.5 % ophthalmic solution Commonly known as: ACULAR Place 1 drop into the right eye 4 (four) times daily.    Magnesium 250 MG Tabs 1 tablet with a meal Orally Once a day for 30 day(s)   pantoprazole 40 MG tablet Commonly known as: PROTONIX Take 40 mg by mouth every morning.   prednisoLONE acetate 1 % ophthalmic suspension Commonly known as: PRED FORTE Place 1 drop into the left eye 4 (four) times daily.   simvastatin 40 MG tablet Commonly known as: ZOCOR Take 40 mg by mouth at bedtime.   tadalafil 20 MG tablet Commonly known as: CIALIS Take 10-20 mg by mouth daily as needed for erectile dysfunction.        Signature:  Coralyn Helling, MD [facility]monary/Critical Care Pager - [phone] 02/21/2023, 4:32 PM

## 2023-03-21 ENCOUNTER — Ambulatory Visit (INDEPENDENT_AMBULATORY_CARE_PROVIDER_SITE_OTHER): Payer: BC Managed Care – PPO | Admitting: Gastroenterology

## 2023-03-22 DIAGNOSIS — H43822 Vitreomacular adhesion, left eye: Secondary | ICD-10-CM | POA: Diagnosis not present

## 2023-03-22 DIAGNOSIS — H524 Presbyopia: Secondary | ICD-10-CM | POA: Diagnosis not present

## 2023-03-22 DIAGNOSIS — H5211 Myopia, right eye: Secondary | ICD-10-CM | POA: Diagnosis not present

## 2023-03-24 ENCOUNTER — Telehealth: Payer: Self-pay | Admitting: Pulmonary Disease

## 2023-03-24 NOTE — Telephone Encounter (Signed)
Sleep study referral was faxed to [phone]

## 2023-04-05 DIAGNOSIS — Z79899 Other long term (current) drug therapy: Secondary | ICD-10-CM | POA: Diagnosis not present

## 2023-04-05 DIAGNOSIS — K219 Gastro-esophageal reflux disease without esophagitis: Secondary | ICD-10-CM | POA: Diagnosis not present

## 2023-04-05 DIAGNOSIS — E785 Hyperlipidemia, unspecified: Secondary | ICD-10-CM | POA: Diagnosis not present

## 2023-04-05 DIAGNOSIS — N529 Male erectile dysfunction, unspecified: Secondary | ICD-10-CM | POA: Diagnosis not present

## 2023-04-05 DIAGNOSIS — Z125 Encounter for screening for malignant neoplasm of prostate: Secondary | ICD-10-CM | POA: Diagnosis not present

## 2023-04-11 DIAGNOSIS — M542 Cervicalgia: Secondary | ICD-10-CM | POA: Diagnosis not present

## 2023-04-11 DIAGNOSIS — G4733 Obstructive sleep apnea (adult) (pediatric): Secondary | ICD-10-CM | POA: Diagnosis not present

## 2023-04-11 DIAGNOSIS — Z0000 Encounter for general adult medical examination without abnormal findings: Secondary | ICD-10-CM | POA: Diagnosis not present

## 2023-04-11 DIAGNOSIS — E785 Hyperlipidemia, unspecified: Secondary | ICD-10-CM | POA: Diagnosis not present

## 2023-04-11 DIAGNOSIS — K219 Gastro-esophageal reflux disease without esophagitis: Secondary | ICD-10-CM | POA: Diagnosis not present

## 2023-06-15 DIAGNOSIS — H43822 Vitreomacular adhesion, left eye: Secondary | ICD-10-CM | POA: Diagnosis not present

## 2023-06-15 DIAGNOSIS — H26493 Other secondary cataract, bilateral: Secondary | ICD-10-CM | POA: Diagnosis not present

## 2023-11-07 ENCOUNTER — Encounter (INDEPENDENT_AMBULATORY_CARE_PROVIDER_SITE_OTHER): Admitting: Gastroenterology

## 2023-11-21 DIAGNOSIS — H02052 Trichiasis without entropian right lower eyelid: Secondary | ICD-10-CM | POA: Diagnosis not present

## 2024-01-12 DIAGNOSIS — D225 Melanocytic nevi of trunk: Secondary | ICD-10-CM | POA: Diagnosis not present

## 2024-01-12 DIAGNOSIS — Z1283 Encounter for screening for malignant neoplasm of skin: Secondary | ICD-10-CM | POA: Diagnosis not present

## 2024-02-15 DIAGNOSIS — H02052 Trichiasis without entropian right lower eyelid: Secondary | ICD-10-CM | POA: Diagnosis not present

## 2024-04-13 DIAGNOSIS — N529 Male erectile dysfunction, unspecified: Secondary | ICD-10-CM | POA: Diagnosis not present

## 2024-04-13 DIAGNOSIS — K219 Gastro-esophageal reflux disease without esophagitis: Secondary | ICD-10-CM | POA: Diagnosis not present

## 2024-04-13 DIAGNOSIS — E785 Hyperlipidemia, unspecified: Secondary | ICD-10-CM | POA: Diagnosis not present

## 2024-04-13 DIAGNOSIS — Z79899 Other long term (current) drug therapy: Secondary | ICD-10-CM | POA: Diagnosis not present

## 2024-04-20 DIAGNOSIS — K219 Gastro-esophageal reflux disease without esophagitis: Secondary | ICD-10-CM | POA: Diagnosis not present

## 2024-04-20 DIAGNOSIS — E785 Hyperlipidemia, unspecified: Secondary | ICD-10-CM | POA: Diagnosis not present

## 2024-04-20 DIAGNOSIS — I493 Ventricular premature depolarization: Secondary | ICD-10-CM | POA: Diagnosis not present

## 2024-04-20 DIAGNOSIS — G4733 Obstructive sleep apnea (adult) (pediatric): Secondary | ICD-10-CM | POA: Diagnosis not present

## 2024-04-20 DIAGNOSIS — Z23 Encounter for immunization: Secondary | ICD-10-CM | POA: Diagnosis not present

## 2024-04-20 DIAGNOSIS — Z0001 Encounter for general adult medical examination with abnormal findings: Secondary | ICD-10-CM | POA: Diagnosis not present

## 2024-05-16 ENCOUNTER — Encounter (INDEPENDENT_AMBULATORY_CARE_PROVIDER_SITE_OTHER): Payer: Self-pay | Admitting: Gastroenterology
